# Patient Record
Sex: Male | Born: 1980 | Race: White | Hispanic: No | Marital: Married | Smoking: Never smoker
Health system: Southern US, Community
[De-identification: ages and names within clinical notes are randomized; demographics above are authoritative.]

## PROBLEM LIST (undated history)

## (undated) DIAGNOSIS — T7840XA Allergy, unspecified, initial encounter: Secondary | ICD-10-CM

## (undated) HISTORY — DX: Allergy, unspecified, initial encounter: T78.40XA

---

## 2004-08-02 HISTORY — PX: TONSILLECTOMY: SUR1361

## 2015-04-23 ENCOUNTER — Ambulatory Visit: Payer: Self-pay | Admitting: Medical

## 2015-04-23 ENCOUNTER — Ambulatory Visit (HOSPITAL_BASED_OUTPATIENT_CLINIC_OR_DEPARTMENT_OTHER)
Admission: RE | Admit: 2015-04-23 | Discharge: 2015-04-23 | Disposition: A | Discharge status: Home / Self Care | Payer: Managed Care, Other (non HMO) | Source: Ambulatory Visit | Attending: Medical | Admitting: Medical

## 2015-04-23 ENCOUNTER — Ambulatory Visit (INDEPENDENT_AMBULATORY_CARE_PROVIDER_SITE_OTHER): Payer: Managed Care, Other (non HMO) | Admitting: Family Medicine

## 2015-04-23 ENCOUNTER — Encounter: Payer: Self-pay | Admitting: Medical

## 2015-04-23 ENCOUNTER — Encounter: Payer: Self-pay | Admitting: Family Medicine

## 2015-04-23 ENCOUNTER — Ambulatory Visit (INDEPENDENT_AMBULATORY_CARE_PROVIDER_SITE_OTHER): Payer: Managed Care, Other (non HMO) | Admitting: Medical

## 2015-04-23 VITALS — BP 114/74 | HR 78 | Ht 69.0 in | Wt 170.0 lb

## 2015-04-23 VITALS — BP 118/76 | HR 88 | Temp 98.1°F | Resp 16 | Ht 68.0 in | Wt 174.0 lb

## 2015-04-23 DIAGNOSIS — M25571 Pain in right ankle and joints of right foot: Secondary | ICD-10-CM | POA: Diagnosis present

## 2015-04-23 DIAGNOSIS — M7661 Achilles tendinitis, right leg: Secondary | ICD-10-CM

## 2015-04-23 NOTE — Patient Instructions (Signed)
Ace wrap or compression sock. Ibuprofen otc. Ice twice daily if pain reflares. Continue rest.  Refer to sports medicine. Get xray today of ankle/achilles area.  Follow up here as needed.  Note also if you want your could schedule CPE.

## 2015-04-23 NOTE — Progress Notes (Signed)
Pre visit review using our clinic review tool, if applicable. No additional management support is needed unless otherwise documented below in the visit note. 

## 2015-04-23 NOTE — Progress Notes (Signed)
   Subjective:    Patient ID: Erik Mayer, male    DOB: 1981/02/17, 34 y.o.   MRN: [id number]  HPI  I have reviewed pt PMH, PSH, FH, Social History and Surgical History  No major medical problems.  Pt is a Education officer, museum, Pt works out 3-5 times a week(cardio and weights), caffeinated beverage very rare. Only green tea, Married- no children.   Pt has some joint pain. Mostly ankle presently. States pain about one month ago. Noticed some soreness noticed first at rest watching tv. But then 2 wks ago ran on treadmill and felt pain more in achilles tendon. Pt did take a break from treadmill. But he still has some faint pain back of heel. But the direct mid achilles pain has subsided.  Pt taking ibuprofen for this.     Review of Systems  Respiratory: Negative for cough, choking, shortness of breath and wheezing.   Cardiovascular: Negative for chest pain and palpitations.  Gastrointestinal: Negative for abdominal pain.  Musculoskeletal:       Rt achilles tendon pain.  Hematological: Negative for adenopathy. Does not bruise/bleed easily.    History reviewed. No pertinent past medical history.  Social History   Social History  . Marital Status: Married    Spouse Name: N/A  . Number of Children: N/A  . Years of Education: N/A   Occupational History  . Not on file.   Social History Main Topics  . Smoking status: Never Smoker   . Smokeless tobacco: Never Used  . Alcohol Use: 0.0 oz/week    0 Standard drinks or equivalent per week     Comment: occasionally. 5 drinks a week.  . Drug Use: No  . Sexual Activity: Yes   Other Topics Concern  . Not on file   Social History Narrative  . No narrative on file    Past Surgical History  Procedure Laterality Date  . Tonsillectomy  2006    Family History  Problem Relation Age of Onset  . Cancer Cousin     No Known Allergies  No current outpatient prescriptions on file prior to visit.   No  current facility-administered medications on file prior to visit.    BP 118/76 mmHg  Pulse 88  Temp(Src) 98.1 F (36.7 C) (Oral)  Resp 16  Ht  (1.727 m)  Wt 174 lb (78.926 kg)  BMI 26.46 kg/m2  SpO2 97%       Objective:   Physical Exam  General- No acute distress. Pleasant patient. Lungs- Clear, even and unlabored. Heart- regular rate and rhythm. Neurologic- CNII- XII grossly intact.  Rt foot/ankle normal- no swelling. No warmth. Pain on plantar flexion in achilles region. Pain at insertion site mild at achilles tendon and calcaneus.       Assessment & Plan:  Ace wrap or compression sock. Ibuprofen otc. Ice twice daily if pain reflares. Continue rest.  Refer to sports medicine. Get xray today of ankle/achilles area.  Follow up here as needed.  Note also if you want your could schedule CPE.

## 2015-04-23 NOTE — Patient Instructions (Signed)
You have Achilles Tendinopathy Ibuprofen 600mg three times a day with food OR aleve 2 tabs twice a day with food for pain and inflammation. Calf raises 3 sets of 10 on level ground once a day first. When these are easy, can do them one legged 3 sets of 10. Finally advance to doing them on a step. Can add heel walks, toe walks forward and backward as well Ice bucket 10-15 minutes at end of day - can ice 3-4 times a day. Avoid uneven ground, hills as much as possible. Heel lifts in shoes or shoes with a natural heel lift. Consider physical therapy, orthotics, nitro patches if not improving as expected. Follow up in 6 weeks. 

## 2015-04-29 NOTE — Progress Notes (Signed)
PCP and referred by: Esperanza Richters, PA-C  Subjective:   HPI: Patient is a 34 y.o. male here for right foot pain.  Patient denies known injury or trauma. He states about 1 month ago he started to get posterior ankle pain. Working out has not helped. Some swelling, tenderness. Tried icing, resting as well. Pain level 4/10. No prior issues.  No past medical history on file.  No current outpatient prescriptions on file prior to visit.   No current facility-administered medications on file prior to visit.    Past Surgical History  Procedure Laterality Date  . Tonsillectomy  2006    No Known Allergies  Social History   Social History  . Marital Status: Married    Spouse Name: N/A  . Number of Children: N/A  . Years of Education: N/A   Occupational History  . Not on file.   Social History Main Topics  . Smoking status: Never Smoker   . Smokeless tobacco: Never Used  . Alcohol Use: 0.0 oz/week    0 Standard drinks or equivalent per week     Comment: occasionally. 5 drinks a week.  . Drug Use: No  . Sexual Activity: Yes   Other Topics Concern  . Not on file   Social History Narrative    Family History  Problem Relation Age of Onset  . Cancer Cousin     BP 114/74 mmHg  Pulse 78  Ht  (1.753 m)  Wt 170 lb (77.111 kg)  BMI 25.09 kg/m2  Review of Systems: See HPI above.    Objective:  Physical Exam:  Gen: NAD  Right foot/ankle: No gross deformity, swelling, ecchymoses FROM ankle.  Able to do calf raise - minimal pain on single leg calf raise. TTP achilles tendon near insertion. Negative ant drawer and talar tilt.   Negative syndesmotic compression. Thompsons test negative. NV intact distally.    Assessment & Plan:  1. Right achilles tendinitis - heel lifts, home exercises reviewed.  Icing, nsaids as needed.  Consider PT, orthotics, nitro patches if not improving.  F/u in 6 weeks.

## 2015-04-29 NOTE — Assessment & Plan Note (Signed)
heel lifts, home exercises reviewed.  Icing, nsaids as needed.  Consider PT, orthotics, nitro patches if not improving.  F/u in 6 weeks.

## 2015-06-04 ENCOUNTER — Ambulatory Visit: Payer: Managed Care, Other (non HMO) | Admitting: Family Medicine

## 2015-07-16 ENCOUNTER — Telehealth: Payer: Self-pay | Admitting: Medical

## 2015-07-16 NOTE — Telephone Encounter (Signed)
Left message for patient to call about Flu Shot °

## 2015-10-01 ENCOUNTER — Telehealth: Payer: Self-pay | Admitting: Medical

## 2015-10-01 NOTE — Telephone Encounter (Signed)
Left message for patient to call about Flu Shot °

## 2016-10-20 ENCOUNTER — Encounter: Payer: Self-pay | Admitting: Medical

## 2016-10-25 ENCOUNTER — Encounter: Payer: Self-pay | Admitting: Medical

## 2016-10-27 ENCOUNTER — Ambulatory Visit (INDEPENDENT_AMBULATORY_CARE_PROVIDER_SITE_OTHER): Payer: Managed Care, Other (non HMO) | Admitting: Medical

## 2016-10-27 ENCOUNTER — Encounter: Payer: Self-pay | Admitting: Medical

## 2016-10-27 ENCOUNTER — Telehealth: Payer: Self-pay | Admitting: Medical

## 2016-10-27 VITALS — BP 102/66 | HR 67 | Temp 97.6°F | Resp 16 | Ht 68.0 in | Wt 172.4 lb

## 2016-10-27 DIAGNOSIS — Z23 Encounter for immunization: Secondary | ICD-10-CM | POA: Diagnosis not present

## 2016-10-27 DIAGNOSIS — Z113 Encounter for screening for infections with a predominantly sexual mode of transmission: Secondary | ICD-10-CM

## 2016-10-27 DIAGNOSIS — Z0000 Encounter for general adult medical examination without abnormal findings: Secondary | ICD-10-CM

## 2016-10-27 LAB — LIPID PANEL
CHOL/HDL RATIO: 3
Cholesterol: 193 mg/dL (ref 0–200)
HDL: 61.2 mg/dL (ref >39.00)
LDL CALC: 118 mg/dL — AB (ref 0–99)
NONHDL: 132.24
TRIGLYCERIDES: 70 mg/dL (ref 0.0–149.0)
VLDL: 14 mg/dL (ref 0.0–40.0)

## 2016-10-27 LAB — CBC WITH DIFFERENTIAL/PLATELET
BASOS ABS: 0 10*3/uL (ref 0.0–0.1)
Basophils Relative: 0.5 % (ref 0.0–3.0)
Eosinophils Absolute: 0.1 10*3/uL (ref 0.0–0.7)
Eosinophils Relative: 2.9 % (ref 0.0–5.0)
HCT: 46.5 % (ref 39.0–52.0)
Hemoglobin: 15.7 g/dL (ref 13.0–17.0)
Lymphocytes Relative: 40.1 % (ref 12.0–46.0)
Lymphs Abs: 1.7 10*3/uL (ref 0.7–4.0)
MCHC: 33.7 g/dL (ref 30.0–36.0)
MCV: 84 fl (ref 78.0–100.0)
MONO ABS: 0.4 10*3/uL (ref 0.1–1.0)
Monocytes Relative: 9.6 % (ref 3.0–12.0)
NEUTROS ABS: 2 10*3/uL (ref 1.4–7.7)
NEUTROS PCT: 46.9 % (ref 43.0–77.0)
PLATELETS: 179 10*3/uL (ref 150.0–400.0)
RBC: 5.54 Mil/uL (ref 4.22–5.81)
RDW: 13.4 % (ref 11.5–15.5)
WBC: 4.2 10*3/uL (ref 4.0–10.5)

## 2016-10-27 LAB — COMPREHENSIVE METABOLIC PANEL
ALK PHOS: 22 U/L — AB (ref 39–117)
ALT: 19 U/L (ref 0–53)
AST: 23 U/L (ref 0–37)
Albumin: 4.6 g/dL (ref 3.5–5.2)
BUN: 17 mg/dL (ref 6–23)
CO2: 28 meq/L (ref 19–32)
Calcium: 9.5 mg/dL (ref 8.4–10.5)
Chloride: 104 mEq/L (ref 96–112)
Creatinine, Ser: 1.09 mg/dL (ref 0.40–1.50)
GFR: 81.09 mL/min (ref >60.00)
GLUCOSE: 91 mg/dL (ref 70–99)
POTASSIUM: 4 meq/L (ref 3.5–5.1)
SODIUM: 139 meq/L (ref 135–145)
TOTAL PROTEIN: 7.1 g/dL (ref 6.0–8.3)
Total Bilirubin: 0.7 mg/dL (ref 0.2–1.2)

## 2016-10-27 LAB — TSH: TSH: 0.85 u[IU]/mL (ref 0.35–4.50)

## 2016-10-27 NOTE — Telephone Encounter (Signed)
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 In yellow folder.Can fax over.

## 2016-10-27 NOTE — Addendum Note (Signed)
Addended by: Regis BillSCATES, SHARON L on: 10/27/2016 10:05 AM   Modules accepted: Orders

## 2016-10-27 NOTE — Progress Notes (Signed)
Pre visit review using our clinic review tool, if applicable. No additional management support is needed unless otherwise documented below in the visit note/SLS  

## 2016-10-27 NOTE — Progress Notes (Signed)
Subjective:    Patient ID: Erik Mayer, male    DOB: 1981/03/08, 36 y.o.   MRN: [id number]  HPI  Pt in for wellness exam. Pt is fasting.  Pt works at  [employer]  Exercise-  5 days a week. Weight lift, run, biking, Walks dog.  Diet- moderate healthy diet.  Caffeine- hot tea.  Smoking status-no.   Alcohol- 10 beers a week but spread out over week. No binge drinking.  Married-yes.  Children-no Pt needs tdap per epic. He agrees to get.   Flu vaccine offered. After dicussion on where we are at in the season he declined.   Review of Systems  Constitutional: Negative for chills, fatigue and fever.  HENT: Negative for congestion, ear discharge, ear pain, postnasal drip, rhinorrhea and sinus pain.   Respiratory: Positive for cough. Negative for chest tightness.        Rare occasional cough just started today.  Cardiovascular: Negative for chest pain and palpitations.  Gastrointestinal: Negative for constipation, diarrhea and vomiting.  Endocrine: Negative for polydipsia, polyphagia and polyuria.  Genitourinary: Negative for dysuria, flank pain, frequency and hematuria.  Musculoskeletal: Negative for back pain, gait problem, myalgias and neck stiffness.  Skin: Negative for rash.  Neurological: Negative for dizziness and headaches.  Hematological: Negative for adenopathy. Does not bruise/bleed easily.  Psychiatric/Behavioral: Negative for agitation, confusion, self-injury and sleep disturbance. The patient is not nervous/anxious.     No past medical history on file.   Social History   Social History  . Marital status: Married    Spouse name: N/A  . Number of children: N/A  . Years of education: N/A   Occupational History  . Not on file.   Social History Main Topics  . Smoking status: Never Smoker  . Smokeless tobacco: Never Used  . Alcohol use 0.0 oz/week     Comment: occasionally. 5 drinks a week.  . Drug use: No  . Sexual  activity: Yes   Other Topics Concern  . Not on file   Social History Narrative  . No narrative on file    Past Surgical History:  Procedure Laterality Date  . TONSILLECTOMY  2006    Family History  Problem Relation Age of Onset  . Cancer Cousin     No Known Allergies  No current outpatient prescriptions on file prior to visit.   No current facility-administered medications on file prior to visit.     BP (!) 0/0   Ht 5\' 8"  (1.727 m)   Wt 172 lb 6 oz (78.2 kg)   BMI 26.21 kg/m  102/66(not sure why state 0/0)??     Objective:   Physical Exam  General Mental Status- Alert. General Appearance- Not in acute distress.   Skin General: Color- Normal Color. Moisture- Normal Moisture.  Neck Carotid Arteries- Normal color. Moisture- Normal Moisture. No carotid bruits. No JVD.  Chest and Lung Exam Auscultation: Breath Sounds:-Normal.  Cardiovascular Auscultation:Rythm- Regular. Murmurs & Other Heart Sounds:Auscultation of the heart reveals- No Murmurs.  Abdomen Inspection:-Inspeection Normal. Palpation/Percussion:Note:No mass. Palpation and Percussion of the abdomen reveal- Non Tender, Non Distended + BS, no rebound or guarding.    Neurologic Cranial Nerve exam:- CN III-XII intact(No nystagmus), symmetric smile. Finger to Nose:- Normal/Intact Strength:- 5/5 equal and symmetric strength both upper and lower extremities.  Genital- pt declined.      Assessment & Plan:  For you wellness exam today I have ordered cbc, cmp, tsh, lipid panel, ua and hiv.  Vaccine given today. tdap.  Recommend continue  exercise and healthy diet.  We will let you know lab results as they come in.  Follow up date appointment will be determined after lab review.   If occasional mild allergies flare can [place] flonase and claritin otc.  When labs in will fill out you form and fax.  Christan Defranco, Ramon Dredge, PA-C

## 2016-10-27 NOTE — Patient Instructions (Addendum)
For you wellness exam today I have ordered cbc, cmp, tsh, lipid panel, ua and hiv.  Vaccine given today. tdap.  Recommend continue  exercise and healthy diet.  We will let you know lab results as they come in.  Follow up date appointment will be determined after lab review.   If occasional mild allergies flare can [place] flonase and claritin otc.  When labs in will fill out you form and fax.   Preventive Care 18-39 Years, Male Preventive care refers to lifestyle choices and visits with your health care provider that can promote health and wellness. What does preventive care include?  A yearly physical exam. This is also called an annual well check.  Dental exams once or twice a year.  Routine eye exams. Ask your health care provider how often you should have your eyes checked.  Personal lifestyle choices, including:  Daily care of your teeth and gums.  Regular physical activity.  Eating a healthy diet.  Avoiding tobacco and drug use.  Limiting alcohol use.  Practicing safe sex. What happens during an annual well check? The services and screenings done by your health care provider during your annual well check will depend on your age, overall health, lifestyle risk factors, and family history of disease. Counseling  Your health care provider may ask you questions about your:  Alcohol use.  Tobacco use.  Drug use.  Emotional well-being.  Home and relationship well-being.  Sexual activity.  Eating habits.  Work and work Statistician. Screening  You may have the following tests or measurements:  Height, weight, and BMI.  Blood pressure.  Lipid and cholesterol levels. These may be checked every 5 years starting at age 17.  Diabetes screening. This is done by checking your blood sugar (glucose) after you have not eaten for a while (fasting).  Skin check.  Hepatitis C blood test.  Hepatitis B blood test.  Sexually transmitted disease (STD)  testing. Discuss your test results, treatment options, and if necessary, the need for more tests with your health care provider. Vaccines  Your health care provider may recommend certain vaccines, such as:  Influenza vaccine. This is recommended every year.  Tetanus, diphtheria, and acellular pertussis (Tdap, Td) vaccine. You may need a Td booster every 10 years.  Varicella vaccine. You may need this if you have not been vaccinated.  HPV vaccine. If you are 79 or younger, you may need three doses over 6 months.  Measles, mumps, and rubella (MMR) vaccine. You may need at least one dose of MMR.You may also need a second dose.  Pneumococcal 13-valent conjugate (PCV13) vaccine. You may need this if you have certain conditions and have not been vaccinated.  Pneumococcal polysaccharide (PPSV23) vaccine. You may need one or two doses if you smoke cigarettes or if you have certain conditions.  Meningococcal vaccine. One dose is recommended if you are age 106-21 years and a first-year college student living in a residence hall, or if you have one of several medical conditions. You may also need additional booster doses.  Hepatitis A vaccine. You may need this if you have certain conditions or if you travel or work in places where you may be exposed to hepatitis A.  Hepatitis B vaccine. You may need this if you have certain conditions or if you travel or work in places where you may be exposed to hepatitis B.  Haemophilus influenzae type b (Hib) vaccine. You may need this if you have certain risk factors. Talk to  your health care provider about which screenings and vaccines you need and how often you need them. This information is not intended to replace advice given to you by your health care provider. Make sure you discuss any questions you have with your health care provider. Document Released: 09/14/2001 Document Revised: 04/07/2016 Document Reviewed: 05/20/2015 Elsevier Interactive Patient  Education  2017 [employer].

## 2016-10-28 LAB — HIV ANTIBODY (ROUTINE TESTING W REFLEX): HIV: NONREACTIVE

## 2016-10-28 NOTE — Progress Notes (Signed)
Viewed by Ardyth Harpshristopher Angelino on 10/27/2016 1:32 PM

## 2016-10-28 NOTE — Telephone Encounter (Signed)
Called and [facility] @ 11:41am @ [phone]) asking the pt to RTC regarding Physician Screening Form.//AB/CMA

## 2016-11-01 NOTE — Telephone Encounter (Signed)
Called patient to give Results and he wanted a note to go back to Angie to let her know that "nothing else is needed for form" and he does not want a copy/SLS 04/02

## 2016-11-01 NOTE — Telephone Encounter (Signed)
Physician Screening Collection Form:Standard has been faxed and confirmation received.  Original sent for scanning.//AB/CMA

## 2018-03-30 ENCOUNTER — Encounter: Payer: Self-pay | Admitting: Medical

## 2018-03-30 ENCOUNTER — Ambulatory Visit (INDEPENDENT_AMBULATORY_CARE_PROVIDER_SITE_OTHER): Payer: Managed Care, Other (non HMO) | Admitting: Medical

## 2018-03-30 VITALS — BP 103/67 | HR 68 | Temp 98.1°F | Resp 16 | Ht 68.0 in | Wt 177.4 lb

## 2018-03-30 DIAGNOSIS — Z0000 Encounter for general adult medical examination without abnormal findings: Secondary | ICD-10-CM | POA: Diagnosis not present

## 2018-03-30 LAB — LIPID PANEL
CHOLESTEROL: 184 mg/dL (ref 0–200)
HDL: 59.6 mg/dL (ref >39.00)
LDL CALC: 114 mg/dL — AB (ref 0–99)
NonHDL: 124.36
TRIGLYCERIDES: 54 mg/dL (ref 0.0–149.0)
Total CHOL/HDL Ratio: 3
VLDL: 10.8 mg/dL (ref 0.0–40.0)

## 2018-03-30 LAB — POC URINALSYSI DIPSTICK (AUTOMATED)
Bilirubin, UA: NEGATIVE
Blood, UA: NEGATIVE
GLUCOSE UA: NEGATIVE
Ketones, UA: NEGATIVE
LEUKOCYTES UA: NEGATIVE
Nitrite, UA: NEGATIVE
PROTEIN UA: NEGATIVE
SPEC GRAV UA: 1.02 (ref 1.010–1.025)
Urobilinogen, UA: NEGATIVE E.U./dL — AB
pH, UA: 6 (ref 5.0–8.0)

## 2018-03-30 LAB — CBC WITH DIFFERENTIAL/PLATELET
BASOS PCT: 0.4 % (ref 0.0–3.0)
Basophils Absolute: 0 10*3/uL (ref 0.0–0.1)
EOS PCT: 2.7 % (ref 0.0–5.0)
Eosinophils Absolute: 0.1 10*3/uL (ref 0.0–0.7)
HEMATOCRIT: 47.5 % (ref 39.0–52.0)
HEMOGLOBIN: 16 g/dL (ref 13.0–17.0)
LYMPHS PCT: 29.5 % (ref 12.0–46.0)
Lymphs Abs: 1.2 10*3/uL (ref 0.7–4.0)
MCHC: 33.6 g/dL (ref 30.0–36.0)
MCV: 83.7 fl (ref 78.0–100.0)
MONO ABS: 0.5 10*3/uL (ref 0.1–1.0)
MONOS PCT: 11.2 % (ref 3.0–12.0)
Neutro Abs: 2.4 10*3/uL (ref 1.4–7.7)
Neutrophils Relative %: 56.2 % (ref 43.0–77.0)
Platelets: 167 10*3/uL (ref 150.0–400.0)
RBC: 5.68 Mil/uL (ref 4.22–5.81)
RDW: 13.3 % (ref 11.5–15.5)
WBC: 4.2 10*3/uL (ref 4.0–10.5)

## 2018-03-30 LAB — COMPREHENSIVE METABOLIC PANEL
ALBUMIN: 4.5 g/dL (ref 3.5–5.2)
ALK PHOS: 30 U/L — AB (ref 39–117)
ALT: 18 U/L (ref 0–53)
AST: 20 U/L (ref 0–37)
BUN: 16 mg/dL (ref 6–23)
CALCIUM: 9.4 mg/dL (ref 8.4–10.5)
CO2: 30 mEq/L (ref 19–32)
Chloride: 105 mEq/L (ref 96–112)
Creatinine, Ser: 1.14 mg/dL (ref 0.40–1.50)
GFR: 76.41 mL/min (ref >60.00)
Glucose, Bld: 76 mg/dL (ref 70–99)
POTASSIUM: 4.5 meq/L (ref 3.5–5.1)
Sodium: 140 mEq/L (ref 135–145)
Total Bilirubin: 0.5 mg/dL (ref 0.2–1.2)
Total Protein: 6.8 g/dL (ref 6.0–8.3)

## 2018-03-30 NOTE — Patient Instructions (Signed)
For you wellness exam today I have ordered cbc, cmp, lipid panel, and ua  Vaccine up to date.  Recommend exercise and healthy diet.  We will let you know lab results as they come in.  Follow up date appointment will be determined after lab review.    Preventive Care 18-39 Years, Male Preventive care refers to lifestyle choices and visits with your health care provider that can promote health and wellness. What does preventive care include?  A yearly physical exam. This is also called an annual well check.  Dental exams once or twice a year.  Routine eye exams. Ask your health care provider how often you should have your eyes checked.  Personal lifestyle choices, including: ? Daily care of your teeth and gums. ? Regular physical activity. ? Eating a healthy diet. ? Avoiding tobacco and drug use. ? Limiting alcohol use. ? Practicing safe sex. What happens during an annual well check? The services and screenings done by your health care provider during your annual well check will depend on your age, overall health, lifestyle risk factors, and family history of disease. Counseling Your health care provider may ask you questions about your:  Alcohol use.  Tobacco use.  Drug use.  Emotional well-being.  Home and relationship well-being.  Sexual activity.  Eating habits.  Work and work Statistician.  Screening You may have the following tests or measurements:  Height, weight, and BMI.  Blood pressure.  Lipid and cholesterol levels. These may be checked every 5 years starting at age 40.  Diabetes screening. This is done by checking your blood sugar (glucose) after you have not eaten for a while (fasting).  Skin check.  Hepatitis C blood test.  Hepatitis B blood test.  Sexually transmitted disease (STD) testing.  Discuss your test results, treatment options, and if necessary, the need for more tests with your health care provider. Vaccines Your health care  provider may recommend certain vaccines, such as:  Influenza vaccine. This is recommended every year.  Tetanus, diphtheria, and acellular pertussis (Tdap, Td) vaccine. You may need a Td booster every 10 years.  Varicella vaccine. You may need this if you have not been vaccinated.  HPV vaccine. If you are 85 or younger, you may need three doses over 6 months.  Measles, mumps, and rubella (MMR) vaccine. You may need at least one dose of MMR.You may also need a second dose.  Pneumococcal 13-valent conjugate (PCV13) vaccine. You may need this if you have certain conditions and have not been vaccinated.  Pneumococcal polysaccharide (PPSV23) vaccine. You may need one or two doses if you smoke cigarettes or if you have certain conditions.  Meningococcal vaccine. One dose is recommended if you are age 9-21 years and a first-year college student living in a residence hall, or if you have one of several medical conditions. You may also need additional booster doses.  Hepatitis A vaccine. You may need this if you have certain conditions or if you travel or work in places where you may be exposed to hepatitis A.  Hepatitis B vaccine. You may need this if you have certain conditions or if you travel or work in places where you may be exposed to hepatitis B.  Haemophilus influenzae type b (Hib) vaccine. You may need this if you have certain risk factors.  Talk to your health care provider about which screenings and vaccines you need and how often you need them. This information is not intended to replace advice given to  you by your health care provider. Make sure you discuss any questions you have with your health care provider. Document Released: 09/14/2001 Document Revised: 04/07/2016 Document Reviewed: 05/20/2015 Elsevier Interactive Patient Education  [employer].

## 2018-03-30 NOTE — Progress Notes (Signed)
Subjective:    Patient ID: Erik Mayer, male    DOB: 09-15-1980, 37 y.o.   MRN: [id number]  HPI  Pt in for CPE. He is fasting.   Up to date on t-dap.  Upcoming vacation coming up to [place].  Pt works at  [employer]  Exercise-  Not exercising like he was last year. But still walks dog.  Diet- moderate healthy diet.  Caffeine- hot tea.  Smoking status-no.   Alcohol- 5 beers a week but spread out over week. No binge drinking.  Married-yes.  Children-no    Review of Systems  Constitutional: Negative for chills, fatigue and fever.  HENT: Negative for congestion, ear discharge and facial swelling.   Respiratory: Negative for cough, chest tightness, shortness of breath and wheezing.   Cardiovascular: Negative for chest pain and palpitations.  Gastrointestinal: Negative for abdominal pain.  Genitourinary: Negative for difficulty urinating, enuresis, flank pain, frequency and urgency.  Musculoskeletal: Negative for back pain.  Skin: Negative for rash.  Neurological: Negative for dizziness, tremors, seizures, speech difficulty, weakness, light-headedness and headaches.  Hematological: Negative for adenopathy. Does not bruise/bleed easily.  Psychiatric/Behavioral: Negative for behavioral problems and confusion.    Past Medical History:  Diagnosis Date  . Allergy      Social History   Socioeconomic History  . Marital status: Married    Spouse name: Not on file  . Number of children: Not on file  . Years of education: Not on file  . Highest education level: Not on file  Occupational History  . Not on file  Social Needs  . Financial resource strain: Not on file  . Food insecurity:    Worry: Not on file    Inability: Not on file  . Transportation needs:    Medical: Not on file    Non-medical: Not on file  Tobacco Use  . Smoking status: Never Smoker  . Smokeless tobacco: Never Used  Substance and Sexual Activity    . Alcohol use: Yes    Alcohol/week: 0.0 standard drinks    Comment: occasionally. 5 drinks a week.  . Drug use: No  . Sexual activity: Yes  Lifestyle  . Physical activity:    Days per week: Not on file    Minutes per session: Not on file  . Stress: Not on file  Relationships  . Social connections:    Talks on phone: Not on file    Gets together: Not on file    Attends religious service: Not on file    Active member of club or organization: Not on file    Attends meetings of clubs or organizations: Not on file    Relationship status: Not on file  . Intimate partner violence:    Fear of current or ex partner: Not on file    Emotionally abused: Not on file    Physically abused: Not on file    Forced sexual activity: Not on file  Other Topics Concern  . Not on file  Social History Narrative  . Not on file    Past Surgical History:  Procedure Laterality Date  . TONSILLECTOMY  2006    Family History  Problem Relation Age of Onset  . Cancer Cousin     No Known Allergies  No current outpatient medications on file prior to visit.   No current facility-administered medications on file prior to visit.     BP 103/67   Pulse 68   Temp 98.1 F (  36.7 C) (Oral)   Resp 16   Ht 5\' 8"  (1.727 m)   Wt 177 lb 6.4 oz (80.5 kg)   SpO2 99%   BMI 26.97 kg/m       Objective:   Physical Exam  General Mental Status- Alert. General Appearance- Not in acute distress.   Skin General: Color- Normal Color. Moisture- Normal Moisture. No worrisome skin lesions on exam today.  Neck Carotid Arteries- Normal color. Moisture- Normal Moisture. No carotid bruits. No JVD.  Chest and Lung Exam Auscultation: Breath Sounds:-Normal.  Cardiovascular Auscultation:Rythm- Regular. Murmurs & Other Heart Sounds:Auscultation of the heart reveals- No Murmurs.  Abdomen Inspection:-Inspeection Normal. Palpation/Percussion:Note:No mass. Palpation and Percussion of the abdomen reveal- Non  Tender, Non Distended + BS, no rebound or guarding.  Neurologic Cranial Nerve exam:- CN III-XII intact(No nystagmus), symmetric smile. Strength:- 5/5 equal and symmetric strength both upper and lower extremities.    HEENT Head- Normal. Ear Auditory Canal - Left- Normal. Right - Normal.Tympanic Membrane- Left- Normal. Right- Normal. Eye Sclera/Conjunctiva- Left- Normal. Right- Normal. Nose & Sinuses Nasal Mucosa- Left-  Boggy and Congested. Right-  Boggy and  Congested.Bilateral no  maxillary and no  frontal sinus pressure. Mouth & Throat Lips: Upper Lip- Normal: no dryness, cracking, pallor, cyanosis, or vesicular eruption. Lower Lip-Normal: no dryness, cracking, pallor, cyanosis or vesicular eruption. Buccal Mucosa- Bilateral- No Aphthous ulcers. Oropharynx- No Discharge or Erythema. Tonsils: Characteristics- Bilateral- No Erythema or Congestion. Size/Enlargement- Bilateral- No enlargement. Discharge- bilateral-None.    .     Assessment & Plan:  For you wellness exam today I have ordered cbc, cmp, lipid panel, and ua  Vaccine up to date.  Recommend exercise and healthy diet.  We will let you know lab results as they come in.  Follow up date appointment will be determined after lab review.    Esperanza RichtersEdward Azayla Polo, PA-C

## 2018-04-04 ENCOUNTER — Telehealth: Payer: Self-pay | Admitting: Medical

## 2018-04-04 NOTE — Telephone Encounter (Signed)
I filled out his quest exam form. Will you fax it. I placed it on your desk.

## 2018-04-04 NOTE — Telephone Encounter (Signed)
For faxed

## 2019-07-09 ENCOUNTER — Other Ambulatory Visit: Payer: Self-pay

## 2019-07-10 ENCOUNTER — Ambulatory Visit (INDEPENDENT_AMBULATORY_CARE_PROVIDER_SITE_OTHER): Payer: Managed Care, Other (non HMO) | Admitting: Medical

## 2019-07-10 ENCOUNTER — Other Ambulatory Visit: Payer: Self-pay

## 2019-07-10 ENCOUNTER — Encounter: Payer: Self-pay | Admitting: Medical

## 2019-07-10 ENCOUNTER — Telehealth: Payer: Self-pay | Admitting: Medical

## 2019-07-10 ENCOUNTER — Ambulatory Visit (HOSPITAL_BASED_OUTPATIENT_CLINIC_OR_DEPARTMENT_OTHER)
Admission: RE | Admit: 2019-07-10 | Discharge: 2019-07-10 | Disposition: A | Discharge status: Home / Self Care | Payer: Managed Care, Other (non HMO) | Source: Ambulatory Visit | Attending: Medical | Admitting: Medical

## 2019-07-10 VITALS — BP 118/64 | HR 64 | Temp 97.7°F | Resp 12 | Ht 68.0 in | Wt 165.2 lb

## 2019-07-10 DIAGNOSIS — S62667A Nondisplaced fracture of distal phalanx of left little finger, initial encounter for closed fracture: Secondary | ICD-10-CM

## 2019-07-10 DIAGNOSIS — M79645 Pain in left finger(s): Secondary | ICD-10-CM | POA: Diagnosis present

## 2019-07-10 NOTE — Progress Notes (Signed)
   Subjective:    Patient ID: Erik Mayer, male    DOB: 1981-04-09, 38 y.o.   MRN: [id number]  HPI  Pt in for 5th digit injury. He smashed his finger between 2- 20 lb dumbells. He did this 5 days ago. He feels dip joint mild unstable.  Rt is right handed.  Pain is minimal most of time.  Review of Systems  Musculoskeletal:       See hpi.       Objective:   Physical Exam  General- no acute distress. Left hand- 5th digit swollen,bruised distal 3rd aspect, mild swollen and faint deviated. Dip joint mild tender. Has about 90 % full flexion and extension.        Assessment & Plan:  For your left 5th digit pain will get xray of digit today.  Can use tylenol or ibuprofen.  Will see if fracture seen. Might need to refer to sports medicine depending on results. If no fracture then recommend splinting and video visit follow up in 5 days to assess rom and joint.  Follow date to be determined.   After pt left his xray came back showing fracture. Decided to refer to sports med MD. Placed the referral and notified pt by my chart.     Mackie Pai, PA-C

## 2019-07-10 NOTE — Patient Instructions (Addendum)
For your left 5th digit pain will get xray of digit today.  Can use tylenol or ibuprofen if needed.  Will see if fracture seen. Might need to refer to sports medicine depending on results. If no fracture then recommend splinting and video visit follow up in 5 days to assess rom and joint.   After pt left his xray came back showing fracture. Decided to refer to sports med MD. Placed the referral and notified pt by my chart.

## 2019-07-10 NOTE — Telephone Encounter (Signed)
Referral to sports medicine placed

## 2019-07-11 ENCOUNTER — Encounter: Payer: Self-pay | Admitting: Family Medicine

## 2019-07-11 ENCOUNTER — Other Ambulatory Visit: Payer: Self-pay

## 2019-07-11 ENCOUNTER — Ambulatory Visit (INDEPENDENT_AMBULATORY_CARE_PROVIDER_SITE_OTHER): Payer: Managed Care, Other (non HMO) | Admitting: Family Medicine

## 2019-07-11 DIAGNOSIS — S62639A Displaced fracture of distal phalanx of unspecified finger, initial encounter for closed fracture: Secondary | ICD-10-CM

## 2019-07-11 NOTE — Progress Notes (Signed)
Erik Mayer - 38 y.o. male MRN [id number]  Date of birth: October 13, 1980  SUBJECTIVE:  Including CC & ROS.  Chief Complaint  Patient presents with  . Finger Injury    left hand pinky finger x 07/06/2019    Erik Mayer is a 37 y.o. male that is  Presenting with pinky finger pain on the left hand. He smashed his distal phalanx between 2 dumbbells.  And x-ray was performed yesterday that showed a comminuted nondisplaced fracture of the distal phalanx.  He is having swelling and pain at this area.  He still maintains flexion and extension.  The pain is worse if it is compressed too tightly.  Denies any numbness or tingling.  Pain is constant and mild..  Independent review of the left finger x-ray from 12/8 shows comminuted fracture of the distal phalanx.   Review of Systems  Constitutional: Negative for fever.  HENT: Negative for congestion.   Respiratory: Negative for cough.   Cardiovascular: Negative for chest pain.  Gastrointestinal: Negative for abdominal pain.  Musculoskeletal: Negative for gait problem.  Skin: Positive for color change.  Neurological: Negative for weakness.  Hematological: Negative for adenopathy.    HISTORY: Past Medical, Surgical, Social, and Family History Reviewed & Updated per EMR.   Pertinent Historical Findings include:  Past Medical History:  Diagnosis Date  . Allergy     Past Surgical History:  Procedure Laterality Date  . TONSILLECTOMY  2006    No Known Allergies  Family History  Problem Relation Age of Onset  . Cancer Cousin      Social History   Socioeconomic History  . Marital status: Married    Spouse name: Not on file  . Number of children: Not on file  . Years of education: Not on file  . Highest education level: Not on file  Occupational History  . Not on file  Social Needs  . Financial resource strain: Not on file  . Food insecurity    Worry: Not on file    Inability: Not on file  . Transportation needs   Medical: Not on file    Non-medical: Not on file  Tobacco Use  . Smoking status: Never Smoker  . Smokeless tobacco: Never Used  Substance and Sexual Activity  . Alcohol use: Yes    Alcohol/week: 0.0 standard drinks    Comment: occasionally. 5 drinks a week.  . Drug use: No  . Sexual activity: Yes  Lifestyle  . Physical activity    Days per week: Not on file    Minutes per session: Not on file  . Stress: Not on file  Relationships  . Social Musician on phone: Not on file    Gets together: Not on file    Attends religious service: Not on file    Active member of club or organization: Not on file    Attends meetings of clubs or organizations: Not on file    Relationship status: Not on file  . Intimate partner violence    Fear of current or ex partner: Not on file    Emotionally abused: Not on file    Physically abused: Not on file    Forced sexual activity: Not on file  Other Topics Concern  . Not on file  Social History Narrative  . Not on file     PHYSICAL EXAM:  VS: BP 118/78   Pulse 86   Ht 5\' 8"  (1.727 m)   Wt 160 lb (72.6  kg)   BMI 24.33 kg/m  Physical Exam Gen: NAD, alert, cooperative with exam, well-appearing ENT: normal lips, normal nasal mucosa,  Eye: normal EOM, normal conjunctiva and lids CV:  no edema, +2 pedal pulses   Resp: no accessory muscle use, non-labored,   Skin: no rashes, no areas of induration  Neuro: normal tone, normal sensation to touch Psych:  normal insight, alert and oriented MSK:  Left hand: Swelling and ecchymosis of the distal phalanx of the pinky finger. Normal flexion extension. Tenderness to palpation at the distal tip of the pinky finger. Neurovascularly intact     ASSESSMENT & PLAN:   Closed fracture of tuft of distal phalanx of finger Injury occurred on 12/4. Has good range of motion.  - placed in baseball splint  - counseled on supportive care - f/u in 3 weeks. Could consider removing splint.

## 2019-07-11 NOTE — Patient Instructions (Signed)
Nice to meet you Please try ice and elevation  Please use the splint  Please send me a message in MyChart with any questions or updates.  Please see me back in 3 weeks.   --Dr. Raeford Razor

## 2019-07-11 NOTE — Assessment & Plan Note (Signed)
Injury occurred on 12/4. Has good range of motion.  - placed in baseball splint  - counseled on supportive care - f/u in 3 weeks. Could consider removing splint.

## 2019-07-31 ENCOUNTER — Encounter: Payer: Self-pay | Admitting: Medical

## 2019-07-31 ENCOUNTER — Other Ambulatory Visit: Payer: Self-pay

## 2019-07-31 ENCOUNTER — Ambulatory Visit (INDEPENDENT_AMBULATORY_CARE_PROVIDER_SITE_OTHER): Payer: Managed Care, Other (non HMO) | Admitting: Medical

## 2019-07-31 VITALS — Ht 68.0 in | Wt 160.0 lb

## 2019-07-31 DIAGNOSIS — M25512 Pain in left shoulder: Secondary | ICD-10-CM | POA: Diagnosis not present

## 2019-07-31 DIAGNOSIS — G8929 Other chronic pain: Secondary | ICD-10-CM | POA: Diagnosis not present

## 2019-07-31 MED ORDER — TRIAMCINOLONE ACETONIDE 0.1 % EX CREA: 1.0000 "application " | TOPICAL_CREAM | Freq: Two times a day (BID) | CUTANEOUS | 1 refills | Status: DC

## 2019-07-31 NOTE — Patient Instructions (Signed)
For chronic left shoulder pain do think best to go ahead and refer to sports medicine. Advise you can call and get scheduled since you were to follow up on previous finger injury as well.   For skin rash will treat with kenalog cream and advise moisturizer. Has eczema type appearance. If does not respond to treatment could also refer you back to dermatologist.  Follow up 10 days or as needed
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
## 2019-08-01 ENCOUNTER — Ambulatory Visit: Payer: Managed Care, Other (non HMO) | Admitting: Family Medicine

## 2019-08-07 ENCOUNTER — Encounter: Payer: Self-pay | Admitting: Family Medicine

## 2019-08-07 ENCOUNTER — Ambulatory Visit (INDEPENDENT_AMBULATORY_CARE_PROVIDER_SITE_OTHER): Payer: Managed Care, Other (non HMO) | Admitting: Family Medicine

## 2019-08-07 ENCOUNTER — Other Ambulatory Visit: Payer: Self-pay

## 2019-08-07 VITALS — BP 114/75 | HR 80 | Ht 68.0 in | Wt 160.0 lb

## 2019-08-07 DIAGNOSIS — M25511 Pain in right shoulder: Secondary | ICD-10-CM

## 2019-08-07 DIAGNOSIS — M25512 Pain in left shoulder: Secondary | ICD-10-CM

## 2019-08-07 DIAGNOSIS — S62639D Displaced fracture of distal phalanx of unspecified finger, subsequent encounter for fracture with routine healing: Secondary | ICD-10-CM | POA: Diagnosis not present

## 2019-08-07 DIAGNOSIS — S62639A Displaced fracture of distal phalanx of unspecified finger, initial encounter for closed fracture: Secondary | ICD-10-CM

## 2019-08-07 DIAGNOSIS — G8929 Other chronic pain: Secondary | ICD-10-CM

## 2019-08-07 NOTE — Patient Instructions (Signed)
Good to see you Please try to use the splint as needed  Please try the range of motion for the finger  Please try the exercises for the shoulder  Please try ice if needed for the finger   Please send me a message in MyChart with any questions or updates.  Please see me back in 4-6 weeks or as needed.   --Dr. Jordan Likes
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
## 2019-08-08 DIAGNOSIS — M25511 Pain in right shoulder: Secondary | ICD-10-CM | POA: Insufficient documentation

## 2019-08-08 NOTE — Assessment & Plan Note (Signed)
Left shoulder with previous injury that could involve more labrum or clavicle. Right shoulder seems more impingement. Mild in nature.  - counseled on HEP and supportive care - consider [place], xray or PT if no improvement. Consider injection.

## 2019-08-08 NOTE — Assessment & Plan Note (Signed)
Injury on 12/4. Has been splinted. Some ecchymosis remains on this time.  - counseled on supportive care - can try weaning out of splint  - begin ROM exercises  - consider repeating imaging if pain still present.

## 2019-08-23 ENCOUNTER — Encounter: Payer: Self-pay | Admitting: Family Medicine

## 2019-08-24 ENCOUNTER — Other Ambulatory Visit: Payer: Self-pay | Admitting: Family Medicine

## 2019-08-24 DIAGNOSIS — S62639A Displaced fracture of distal phalanx of unspecified finger, initial encounter for closed fracture: Secondary | ICD-10-CM

## 2019-08-28 ENCOUNTER — Other Ambulatory Visit: Payer: Self-pay

## 2019-08-28 ENCOUNTER — Ambulatory Visit (HOSPITAL_BASED_OUTPATIENT_CLINIC_OR_DEPARTMENT_OTHER)
Admission: RE | Admit: 2019-08-28 | Discharge: 2019-08-28 | Disposition: A | Discharge status: Home / Self Care | Payer: Managed Care, Other (non HMO) | Source: Ambulatory Visit | Attending: Family Medicine | Admitting: Family Medicine

## 2019-08-28 DIAGNOSIS — S62639A Displaced fracture of distal phalanx of unspecified finger, initial encounter for closed fracture: Secondary | ICD-10-CM | POA: Diagnosis present

## 2019-08-30 ENCOUNTER — Telehealth: Payer: Self-pay | Admitting: Family Medicine

## 2019-08-30 NOTE — Telephone Encounter (Signed)
Informed of xrays.   Myra Rude, MD [facility] 08/30/2019, 8:48 AM

## 2019-09-21 ENCOUNTER — Encounter: Payer: Self-pay | Admitting: Family Medicine

## 2019-09-24 ENCOUNTER — Other Ambulatory Visit: Payer: Self-pay | Admitting: Family Medicine

## 2019-09-24 DIAGNOSIS — S62639A Displaced fracture of distal phalanx of unspecified finger, initial encounter for closed fracture: Secondary | ICD-10-CM

## 2019-12-14 ENCOUNTER — Encounter: Payer: Self-pay | Admitting: Medical

## 2019-12-17 MED ORDER — TRIAMCINOLONE ACETONIDE 0.1 % EX CREA: 1.0000 "application " | TOPICAL_CREAM | Freq: Two times a day (BID) | CUTANEOUS | 1 refills | Status: DC

## 2020-02-28 ENCOUNTER — Ambulatory Visit (INDEPENDENT_AMBULATORY_CARE_PROVIDER_SITE_OTHER): Payer: Managed Care, Other (non HMO) | Admitting: Medical

## 2020-02-28 ENCOUNTER — Other Ambulatory Visit: Payer: Self-pay

## 2020-02-28 VITALS — BP 102/70 | HR 68 | Resp 18 | Ht 68.0 in | Wt 157.0 lb

## 2020-02-28 DIAGNOSIS — Z0000 Encounter for general adult medical examination without abnormal findings: Secondary | ICD-10-CM | POA: Diagnosis not present

## 2020-02-28 LAB — CBC WITH DIFFERENTIAL/PLATELET
Basophils Absolute: 0 10*3/uL (ref 0.0–0.1)
Basophils Relative: 0.6 % (ref 0.0–3.0)
Eosinophils Absolute: 0.2 10*3/uL (ref 0.0–0.7)
Eosinophils Relative: 4.1 % (ref 0.0–5.0)
HCT: 44.6 % (ref 39.0–52.0)
Hemoglobin: 14.9 g/dL (ref 13.0–17.0)
Lymphocytes Relative: 35.9 % (ref 12.0–46.0)
Lymphs Abs: 1.5 10*3/uL (ref 0.7–4.0)
MCHC: 33.3 g/dL (ref 30.0–36.0)
MCV: 86 fl (ref 78.0–100.0)
Monocytes Absolute: 0.4 10*3/uL (ref 0.1–1.0)
Monocytes Relative: 10.2 % (ref 3.0–12.0)
Neutro Abs: 2 10*3/uL (ref 1.4–7.7)
Neutrophils Relative %: 49.2 % (ref 43.0–77.0)
Platelets: 151 10*3/uL (ref 150.0–400.0)
RBC: 5.19 Mil/uL (ref 4.22–5.81)
RDW: 13.3 % (ref 11.5–15.5)
WBC: 4.1 10*3/uL (ref 4.0–10.5)

## 2020-02-28 LAB — COMPREHENSIVE METABOLIC PANEL
ALT: 31 U/L (ref 0–53)
AST: 32 U/L (ref 0–37)
Albumin: 4.2 g/dL (ref 3.5–5.2)
Alkaline Phosphatase: 32 U/L — ABNORMAL LOW (ref 39–117)
BUN: 22 mg/dL (ref 6–23)
CO2: 29 mEq/L (ref 19–32)
Calcium: 9.1 mg/dL (ref 8.4–10.5)
Chloride: 107 mEq/L (ref 96–112)
Creatinine, Ser: 1.13 mg/dL (ref 0.40–1.50)
GFR: 71.9 mL/min (ref >60.00)
Glucose, Bld: 89 mg/dL (ref 70–99)
Potassium: 4.5 mEq/L (ref 3.5–5.1)
Sodium: 140 mEq/L (ref 135–145)
Total Bilirubin: 0.4 mg/dL (ref 0.2–1.2)
Total Protein: 6.5 g/dL (ref 6.0–8.3)

## 2020-02-28 LAB — LIPID PANEL
Cholesterol: 177 mg/dL (ref 0–200)
HDL: 70.1 mg/dL (ref >39.00)
LDL Cholesterol: 99 mg/dL (ref 0–99)
NonHDL: 106.5
Total CHOL/HDL Ratio: 3
Triglycerides: 39 mg/dL (ref 0.0–149.0)
VLDL: 7.8 mg/dL (ref 0.0–40.0)

## 2020-02-28 NOTE — Progress Notes (Signed)
Subjective:    Patient ID: Erik Mayer, male    DOB: 09/15/1980, 39 y.o.   MRN: [id number]  HPI  Pt in for CPE. He is fasting.   Up to date on t-dap.   Pt works [employer]  Exercise- Pt has been exercising 6-7 days a week. Weight lifting. Walks dog.  Diet-moderate healthy diet.  Caffeine-hot tea.  Smoking status-no.  Alcohol-3 beers a week but spread out over week. No binge drinking.  Married-yes.  Children-no  Pt has had covid vaccine series.  Review of Systems  Constitutional: Negative for chills, fatigue and fever.  Respiratory: Negative for cough, chest tightness, shortness of breath and wheezing.   Cardiovascular: Negative for chest pain and palpitations.  Gastrointestinal: Negative for abdominal pain, constipation, nausea and vomiting.  Endocrine: Negative for polydipsia, polyphagia and polyuria.  Genitourinary: Negative for dysuria.  Musculoskeletal: Negative for back pain.       Rt shoulder pain for about 2 years. Pain doing bench presses. On and off.   Pt has seen sports med. If pain persists he will let me know and will refer to PT.  Skin: Negative for rash.  Neurological: Negative for dizziness, seizures and headaches.  Hematological: Negative for adenopathy. Does not bruise/bleed easily.  Psychiatric/Behavioral: Negative for behavioral problems, decreased concentration, dysphoric mood and suicidal ideas. The patient is not nervous/anxious.    Past Medical History:  Diagnosis Date   Allergy      Social History   Socioeconomic History   Marital status: Married    Spouse name: Not on file   Number of children: Not on file   Years of education: Not on file   Highest education level: Not on file  Occupational History   Not on file  Tobacco Use   Smoking status: Never Smoker   Smokeless tobacco: Never Used  Substance and Sexual Activity   Alcohol use: Yes    Alcohol/week: 0.0  standard drinks    Comment: occasionally. 5 drinks a week.   Drug use: No   Sexual activity: Yes  Other Topics Concern   Not on file  Social History Narrative   Not on file   Social Determinants of Health   Financial Resource Strain:    Difficulty of Paying Living Expenses:   Food Insecurity:    Worried About Programme researcher, broadcasting/film/video in the Last Year:    Barista in the Last Year:   Transportation Needs:    Freight forwarder (Medical):    Lack of Transportation (Non-Medical):   Physical Activity:    Days of Exercise per Week:    Minutes of Exercise per Session:   Stress:    Feeling of Stress :   Social Connections:    Frequency of Communication with Friends and Family:    Frequency of Social Gatherings with Friends and Family:    Attends Religious Services:    Active Member of Clubs or Organizations:    Attends Engineer, structural:    Marital Status:   Intimate Partner Violence:    Fear of Current or Ex-Partner:    Emotionally Abused:    Physically Abused:    Sexually Abused:     Past Surgical History:  Procedure Laterality Date   TONSILLECTOMY  2006    Family History  Problem Relation Age of Onset   Cancer Cousin     No Known Allergies  Current Outpatient Medications on File Prior to Visit  Medication  Sig Dispense Refill   triamcinolone cream (KENALOG) 0.1 % Apply 1 application topically 2 (two) times daily. 30 g 1   No current facility-administered medications on file prior to visit.    BP 102/70 (BP Location: Left Arm, Patient Position: Sitting, Cuff Size: Normal)    Pulse 68    Resp 18    Ht 5\' 8"  (1.727 m)    Wt 157 lb (71.2 kg)    SpO2 98%    BMI 23.87 kg/m        Objective:   Physical Exam  General Mental Status- Alert. General Appearance- Not in acute distress.   Skin General: Color- Normal Color. Moisture- Normal Moisture. No worrisome moles or lesion.  Neck Carotid Arteries- Normal color.  Moisture- Normal Moisture. No carotid bruits. No JVD.  Chest and Lung Exam Auscultation: Breath Sounds:-Normal.  Cardiovascular Auscultation:Rythm- Regular. Murmurs & Other Heart Sounds:Auscultation of the heart reveals- No Murmurs.  Abdomen Inspection:-Inspeection Normal. Palpation/Percussion:Note:No mass. Palpation and Percussion of the abdomen reveal- Non Tender, Non Distended + BS, no rebound or guarding.    Neurologic Cranial Nerve exam:- CN III-XII intact(No nystagmus), symmetric smile. Finger to Nose:- Normal/Intact Strength:- 5/5 equal and symmetric strength both upper and lower extremities.  Genital exam- deferred.       Assessment & Plan:  For you wellness exam today I have ordered cbc, cmp and lipid panel.  Vaccine on review appear up to date.  Recommend continue exercise and healthy diet.  We will let you know lab results as they come in.  Follow up date appointment will be determined after lab review.   , PA-C

## 2020-02-28 NOTE — Patient Instructions (Addendum)
For you wellness exam today I have ordered cbc, cmp and lipid panel.  Vaccine on review appear up to date.  Recommend continue exercise and healthy diet.  We will let you know lab results as they come in.  Follow up date appointment will be determined after lab review.    Preventive Care 24-40 Years Old, Male Preventive care refers to lifestyle choices and visits with your health care provider that can promote health and wellness. This includes:  A yearly physical exam. This is also called an annual well check.  Regular dental and eye exams.  Immunizations.  Screening for certain conditions.  Healthy lifestyle choices, such as eating a healthy diet, getting regular exercise, not using drugs or products that contain nicotine and tobacco, and limiting alcohol use. What can I expect for my preventive care visit? Physical exam Your health care provider will check:  Height and weight. These may be used to calculate body mass index (BMI), which is a measurement that tells if you are at a healthy weight. Heart rate and blood pressure.  Your skin for abnormal spots. Counseling Your health care provider may ask you questions about:  Alcohol, tobacco, and drug use.  Emotional well-being.  Home and relationship well-being.  Sexual activity.  Eating habits.  Work and work Statistician. What immunizations do I need?  Influenza (flu) vaccine  This is recommended every year. Tetanus, diphtheria, and pertussis (Tdap) vaccine  You may need a Td booster every 10 years. Varicella (chickenpox) vaccine  You may need this vaccine if you have not already been vaccinated. Human papillomavirus (HPV) vaccine  If recommended by your health care provider, you may need three doses over 6 months. Measles, mumps, and rubella (MMR) vaccine  You may need at least one dose of MMR. You may also need a second dose. Meningococcal conjugate (MenACWY) vaccine  One dose is recommended if you  are 59-57 years old and a Market researcher living in a residence hall, or if you have one of several medical conditions. You may also need additional booster doses. Pneumococcal conjugate (PCV13) vaccine  You may need this if you have certain conditions and were not previously vaccinated. Pneumococcal polysaccharide (PPSV23) vaccine  You may need one or two doses if you smoke cigarettes or if you have certain conditions. Hepatitis A vaccine  You may need this if you have certain conditions or if you travel or work in places where you may be exposed to hepatitis A. Hepatitis B vaccine  You may need this if you have certain conditions or if you travel or work in places where you may be exposed to hepatitis B. Haemophilus influenzae type b (Hib) vaccine  You may need this if you have certain risk factors. You may receive vaccines as individual doses or as more than one vaccine together in one shot (combination vaccines). Talk with your health care provider about the risks and benefits of combination vaccines. What tests do I need? Blood tests  Lipid and cholesterol levels. These may be checked every 5 years starting at age 7.  Hepatitis C test.  Hepatitis B test. Screening   Diabetes screening. This is done by checking your blood sugar (glucose) after you have not eaten for a while (fasting).  Sexually transmitted disease (STD) testing. Talk with your health care provider about your test results, treatment options, and if necessary, the need for more tests. Follow these instructions at home: Eating and drinking   Eat a diet that includes  fresh fruits and vegetables, whole grains, lean protein, and low-fat dairy products.  Take vitamin and mineral supplements as recommended by your health care provider.  Do not drink alcohol if your health care provider tells you not to drink.  If you drink alcohol: ? Limit how much you have to 0-2 drinks a day. ? Be aware of how  much alcohol is in your drink. In the [place] one drink equals one 12 oz bottle of beer (355 mL), one 5 oz glass of wine (148 mL), or one 1 oz glass of hard liquor (44 mL). Lifestyle  Take daily care of your teeth and gums.  Stay active. Exercise for at least 30 minutes on 5 or more days each week.  Do not use any products that contain nicotine or tobacco, such as cigarettes, e-cigarettes, and chewing tobacco. If you need help quitting, ask your health care provider.  If you are sexually active, practice safe sex. Use a condom or other form of protection to prevent STIs (sexually transmitted infections). What's next?  Go to your health care provider once a year for a well check visit.  Ask your health care provider how often you should have your eyes and teeth checked.  Stay up to date on all vaccines. This information is not intended to replace advice given to you by your health care provider. Make sure you discuss any questions you have with your health care provider. Document Revised: 07/13/2018 Document Reviewed: 07/13/2018 Elsevier Patient Education  [place].   Testicular Self-Exam A self-exam of your testicles (testicular self-exam) is looking at and feeling your testicles for unusual lumps or swelling. Swelling, lumps, or pain can be caused by:  Injuries.  Puffiness, redness, and soreness (inflammation).  Infection.  Extra fluids around your testicle (hydrocele).  Twisted testicles (testicular torsion).  Cancer of the testicle (testicular cancer). Why is it important to do a self-exam of testicles? You may need to do self-exams if you are at risk for cancer of the testicles. You may be at risk if you have:  A testicle that has not descended (cryptorchidism).  A history of cancer of the testicle.  A family history of cancer of the testicle. How to do a self-exam of testicles It is easiest to do a self-exam after a warm bath or shower. Testicles are harder  to examine when you are cold. A normal testicle is egg-shaped and feels firm. It is smooth, and it is not tender. At the back of your testicles, there is a firm cord that feels like spaghetti (spermatic cord). Look and feel for changes  Stand and hold your penis away from your body.  Look at each testicle to check for lumps or swelling.  Roll each testicle between your thumb and finger. Feel the whole testicle. Feel for: ? Lumps. ? Swelling. ? Discomfort.  Check for swelling or tender bumps in the groin area. Your groin is where your lower belly (abdomen) meets your upper thighs. Contact a health care provider if:  You find a bump or lump. This may be like a small, hard bump that is the size of a pea.  You find swelling.  You find pain.  You find soreness.  You see or feel any other changes. Summary  A self-exam of your testicles is looking at and feeling your testicles for lumps or swelling.  You may need to do self-exams if you are at risk for cancer of the testicle.  You should check each  of your testicles for lumps, swelling, or discomfort.  You should check for swelling or tender bumps in the groin area. Your groin is where your lower belly (abdomen) meets your upper thighs. This information is not intended to replace advice given to you by your health care provider. Make sure you discuss any questions you have with your health care provider. Document Revised: 11/09/2018 Document Reviewed: 06/14/2016 Elsevier Patient Education  2020 [employer].

## 2020-06-07 ENCOUNTER — Other Ambulatory Visit: Payer: Self-pay | Admitting: Medical

## 2020-06-09 ENCOUNTER — Telehealth: Payer: Self-pay | Admitting: Medical

## 2020-06-09 ENCOUNTER — Encounter: Payer: Self-pay | Admitting: Medical

## 2020-06-09 DIAGNOSIS — G8929 Other chronic pain: Secondary | ICD-10-CM

## 2020-06-09 NOTE — Telephone Encounter (Signed)
Referral to sports med placed.
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
## 2020-06-12 ENCOUNTER — Encounter: Payer: Self-pay | Admitting: Family Medicine

## 2020-06-12 ENCOUNTER — Other Ambulatory Visit: Payer: Self-pay

## 2020-06-12 ENCOUNTER — Ambulatory Visit (INDEPENDENT_AMBULATORY_CARE_PROVIDER_SITE_OTHER): Payer: Managed Care, Other (non HMO) | Admitting: Family Medicine

## 2020-06-12 ENCOUNTER — Ambulatory Visit (INDEPENDENT_AMBULATORY_CARE_PROVIDER_SITE_OTHER): Payer: Managed Care, Other (non HMO)

## 2020-06-12 ENCOUNTER — Ambulatory Visit: Payer: Self-pay

## 2020-06-12 VITALS — BP 110/70 | HR 70 | Ht 68.0 in | Wt 165.2 lb

## 2020-06-12 DIAGNOSIS — M25511 Pain in right shoulder: Secondary | ICD-10-CM | POA: Diagnosis not present

## 2020-06-12 MED ORDER — NITROGLYCERIN 0.2 MG/HR TD PT24: MEDICATED_PATCH | TRANSDERMAL | 1 refills | Status: DC

## 2020-06-12 NOTE — Patient Instructions (Addendum)
Thank you for coming in today.  I've referred you to Physical Therapy.  Let us know if you don't hear from them in one week.  Continue rotator cuff strength program.   Add nitro patches.   Recheck in 6-8 weeks.  Let me know sooner if you have a problem.   Please get an Xray today before you leave  Nitroglycerin Protocol   Apply 1/4 nitroglycerin patch to affected area daily.  Change position of patch within the affected area every 24 hours.  You may experience a headache during the first 1-2 weeks of using the patch, these should subside.  If you experience headaches after beginning nitroglycerin patch treatment, you may take your preferred over the counter pain reliever.  Another side effect of the nitroglycerin patch is skin irritation or rash related to patch adhesive.  Please notify our office if you develop more severe headaches or rash, and stop the patch.  Tendon healing with nitroglycerin patch may require 12 to 24 weeks depending on the extent of injury.  Men should not use if taking Viagra, Cialis, or Levitra.   Do not use if you have migraines or rosacea.

## 2020-06-13 NOTE — Progress Notes (Signed)
X-ray right shoulder normal per radiology

## 2020-07-11 IMAGING — CR DG FINGER LITTLE 2+V*L*
3 series · 3 of 3 positions shown · non-contrast
Comparison: 07/10/2019

CLINICAL DATA: Follow-up fracture

EXAM:
LEFT LITTLE FINGER 2+V

[x finger pa left]
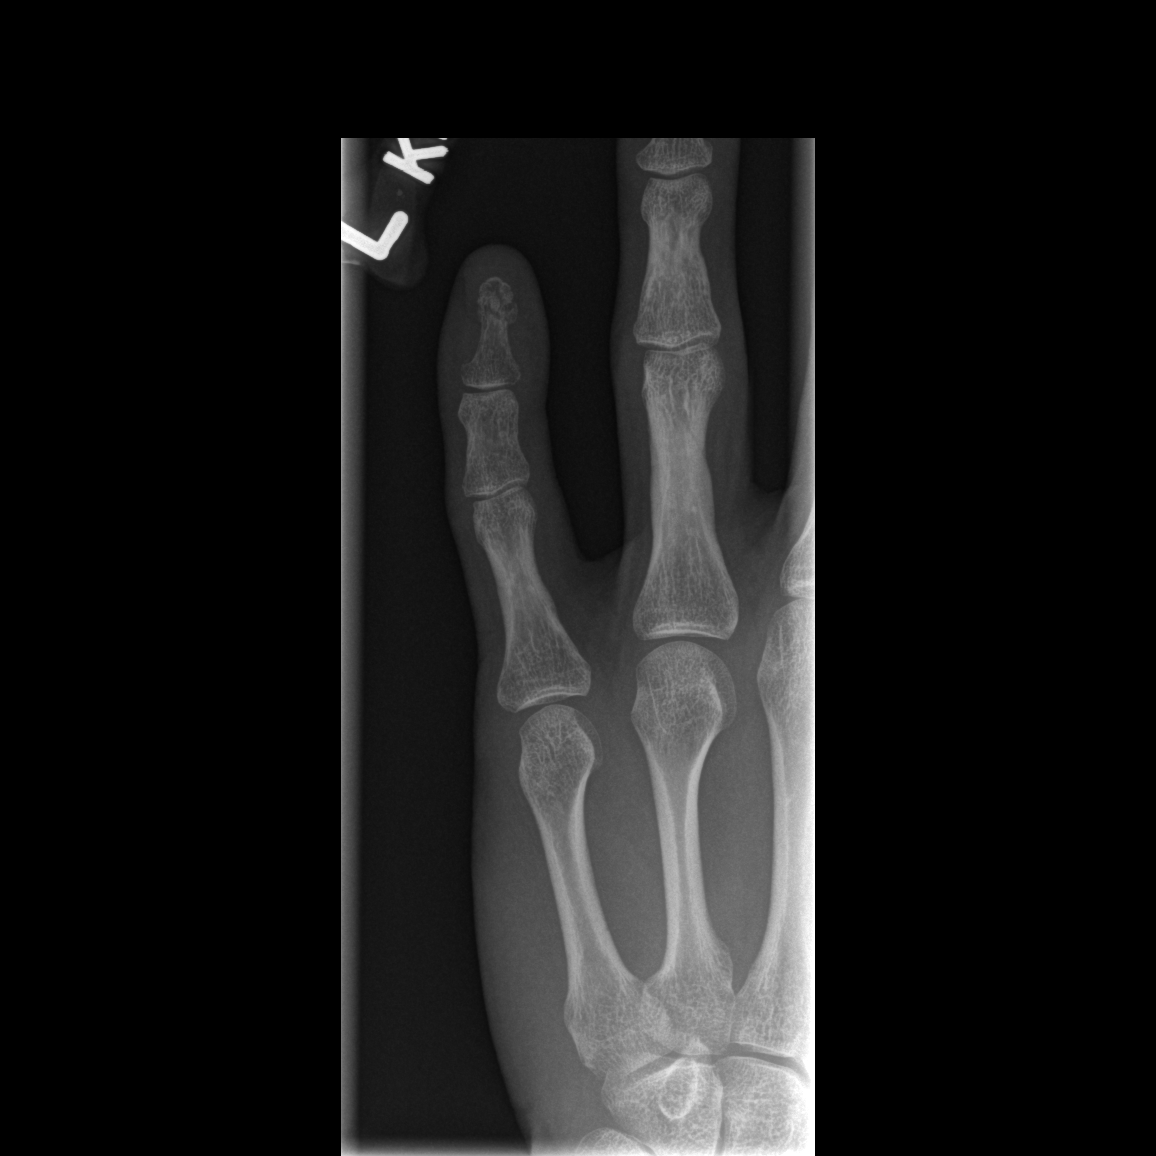

[x finger obl. left]
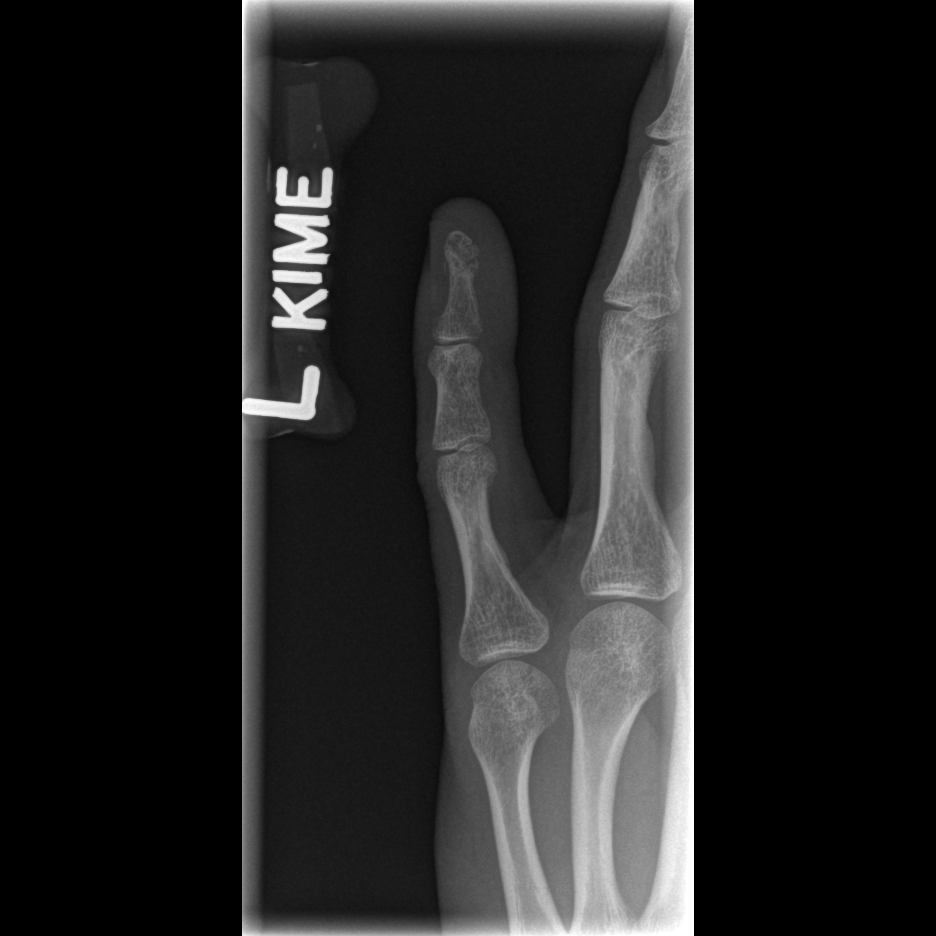

[x finger lateral left]
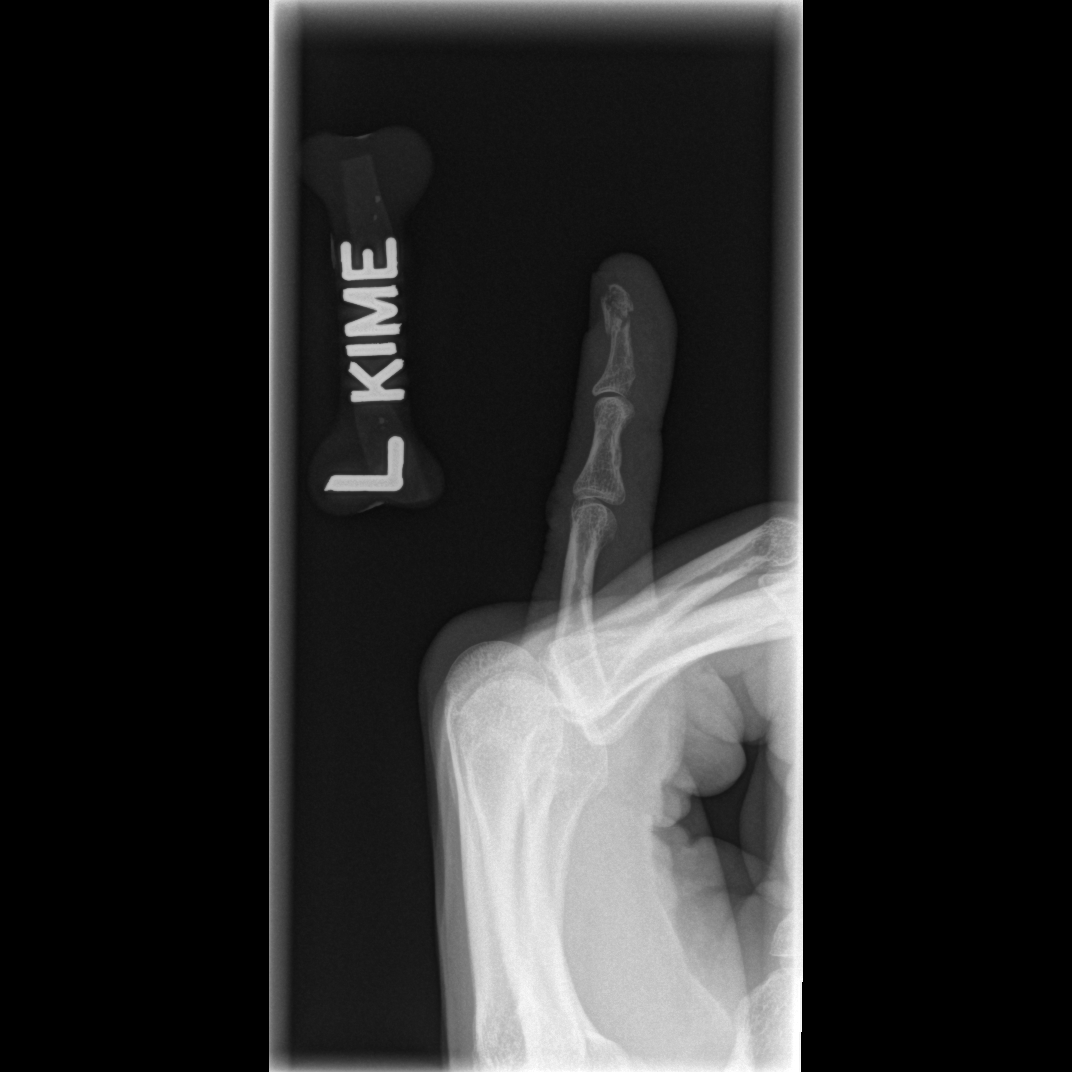

[3 of 3 positions shown; findings below may reference images not displayed]

FINDINGS: Comminuted tuft fracture of the fifth distal phalanx without
significant change in alignment. Minimal dorsal displacement of
distal fracture fragment. Not much bony bridging with readily
visible fracture lucencies.
IMPRESSION: No significant change in alignment of comminuted tuft fracture of
the fifth distal phalanx, without significant interval bony bridging
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
## 2020-07-29 ENCOUNTER — Ambulatory Visit (INDEPENDENT_AMBULATORY_CARE_PROVIDER_SITE_OTHER): Payer: Managed Care, Other (non HMO) | Admitting: Family Medicine

## 2020-07-29 ENCOUNTER — Other Ambulatory Visit: Payer: Self-pay

## 2020-07-29 ENCOUNTER — Ambulatory Visit: Payer: Self-pay

## 2020-07-29 ENCOUNTER — Encounter: Payer: Self-pay | Admitting: Family Medicine

## 2020-07-29 VITALS — BP 102/68 | HR 79 | Ht 68.0 in | Wt 164.2 lb

## 2020-07-29 DIAGNOSIS — M25511 Pain in right shoulder: Secondary | ICD-10-CM

## 2020-07-29 NOTE — Patient Instructions (Signed)
Thank you for coming in today.  I think you will continue to improve.   Continue home exercises.   Plan for nitro patches for about a total of 3 months.   Recheck with me as needed.   Next step is not good enough is usually MRI.  Let me know.

## 2020-11-24 ENCOUNTER — Encounter: Payer: Self-pay | Admitting: Physician Assistant

## 2020-11-24 ENCOUNTER — Telehealth: Payer: Managed Care, Other (non HMO) | Admitting: Physician Assistant

## 2020-11-24 DIAGNOSIS — B9689 Other specified bacterial agents as the cause of diseases classified elsewhere: Secondary | ICD-10-CM

## 2020-11-24 DIAGNOSIS — J0140 Acute pansinusitis, unspecified: Secondary | ICD-10-CM

## 2020-11-24 DIAGNOSIS — J208 Acute bronchitis due to other specified organisms: Secondary | ICD-10-CM | POA: Diagnosis not present

## 2020-11-24 MED ORDER — AMOXICILLIN-POT CLAVULANATE 875-125 MG PO TABS: 1.0000 | ORAL_TABLET | Freq: Two times a day (BID) | ORAL | 0 refills | Status: DC

## 2020-11-24 MED ORDER — PREDNISONE 10 MG (21) PO TBPK: ORAL_TABLET | ORAL | 0 refills | Status: DC

## 2020-11-24 NOTE — Patient Instructions (Signed)
 Sinusitis, Adult Sinusitis is soreness and swelling (inflammation) of your sinuses. Sinuses are hollow spaces in the bones around your face. They are located:  Around your eyes.  In the middle of your forehead.  Behind your nose.  In your cheekbones. Your sinuses and nasal passages are lined with a fluid called mucus. Mucus drains out of your sinuses. Swelling can trap mucus in your sinuses. This lets germs (bacteria, virus, or fungus) grow, which leads to infection. Most of the time, this condition is caused by a virus. What are the causes? This condition is caused by:  Allergies.  Asthma.  Germs.  Things that block your nose or sinuses.  Growths in the nose (nasal polyps).  Chemicals or irritants in the air.  Fungus (rare). What increases the risk? You are more likely to develop this condition if:  You have a weak body defense system (immune system).  You do a lot of swimming or diving.  You use nasal sprays too much.  You smoke. What are the signs or symptoms? The main symptoms of this condition are pain and a feeling of pressure around the sinuses. Other symptoms include:  Stuffy nose (congestion).  Runny nose (drainage).  Swelling and warmth in the sinuses.  Headache.  Toothache.  A cough that may get worse at night.  Mucus that collects in the throat or the back of the nose (postnasal drip).  Being unable to smell and taste.  Being very tired (fatigue).  A fever.  Sore throat.  Bad breath. How is this diagnosed? This condition is diagnosed based on:  Your symptoms.  Your medical history.  A physical exam.  Tests to find out if your condition is short-term (acute) or long-term (chronic). Your doctor may: ? Check your nose for growths (polyps). ? Check your sinuses using a tool that has a light (endoscope). ? Check for allergies or germs. ? Do imaging tests, such as an MRI or CT scan. How is this treated? Treatment for this  condition depends on the cause and whether it is short-term or long-term.  If caused by a virus, your symptoms should go away on their own within 10 days. You may be given medicines to relieve symptoms. They include: ? Medicines that shrink swollen tissue in the nose. ? Medicines that treat allergies (antihistamines). ? A spray that treats swelling of the nostrils. ? Rinses that help get rid of thick mucus in your nose (nasal saline washes).  If caused by bacteria, your doctor may wait to see if you will get better without treatment. You may be given antibiotic medicine if you have: ? A very bad infection. ? A weak body defense system.  If caused by growths in the nose, you may need to have surgery. Follow these instructions at home: Medicines  Take, use, or apply over-the-counter and prescription medicines only as told by your doctor. These may include nasal sprays.  If you were prescribed an antibiotic medicine, take it as told by your doctor. Do not stop taking the antibiotic even if you start to feel better. Hydrate and humidify  Drink enough water to keep your pee (urine) pale yellow.  Use a cool mist humidifier to keep the humidity level in your home above 50%.  Breathe in steam for 10-15 minutes, 3-4 times a day, or as told by your doctor. You can do this in the bathroom while a hot shower is running.  Try not to spend time in cool or dry air.     Rest  Rest as much as you can.  Sleep with your head raised (elevated).  Make sure you get enough sleep each night. General instructions  Put a warm, moist washcloth on your face 3-4 times a day, or as often as told by your doctor. This will help with discomfort.  Wash your hands often with soap and water. If there is no soap and water, use hand sanitizer.  Do not smoke. Avoid being around people who are smoking (secondhand smoke).  Keep all follow-up visits as told by your doctor. This is important.   Contact a doctor  if:  You have a fever.  Your symptoms get worse.  Your symptoms do not get better within 10 days. Get help right away if:  You have a very bad headache.  You cannot stop throwing up (vomiting).  You have very bad pain or swelling around your face or eyes.  You have trouble seeing.  You feel confused.  Your neck is stiff.  You have trouble breathing. Summary  Sinusitis is swelling of your sinuses. Sinuses are hollow spaces in the bones around your face.  This condition is caused by tissues in your nose that become inflamed or swollen. This traps germs. These can lead to infection.  If you were prescribed an antibiotic medicine, take it as told by your doctor. Do not stop taking it even if you start to feel better.  Keep all follow-up visits as told by your doctor. This is important. This information is not intended to replace advice given to you by your health care provider. Make sure you discuss any questions you have with your health care provider. Document Revised: 12/19/2017 Document Reviewed: 12/19/2017 Elsevier Patient Education  2021 Elsevier Inc.  Acute Bronchitis, Adult  Acute bronchitis is when air tubes in the lungs (bronchi) suddenly get swollen. The condition can make it hard for you to breathe. In adults, acute bronchitis usually goes away within 2 weeks. A cough caused by bronchitis may last up to 3 weeks. Smoking, allergies, and asthma can make the condition worse. What are the causes? This condition is caused by:  Cold and flu viruses. The most common cause of this condition is the virus that causes the common cold.  Bacteria.  Substances that irritate the lungs, including: ? Smoke from cigarettes and other types of tobacco. ? Dust and pollen. ? Fumes from chemicals, gases, or burned fuel. ? Other materials that pollute indoor or outdoor air.  Close contact with someone who has acute bronchitis. What increases the risk? The following factors may  make you more likely to develop this condition:  A weak body's defense system. This is also called the immune system.  Any condition that affects your lungs and breathing, such as asthma. What are the signs or symptoms? Symptoms of this condition include:  A cough.  Coughing up clear, yellow, or green mucus.  Wheezing.  Chest congestion.  Shortness of breath.  A fever.  Body aches.  Chills.  A sore throat. How is this treated? Acute bronchitis may go away over time without treatment. Your doctor may recommend:  Drinking more fluids.  Taking a medicine for a fever or cough.  Using a device that gets medicine into your lungs (inhaler).  Using a vaporizer or a humidifier. These are machines that add water or moisture in the air to help with coughing and poor breathing. Follow these instructions at home: Activity  Get a lot of rest.  Avoid places where   there are fumes from chemicals.  Return to your normal activities as told by your doctor. Ask your doctor what activities are safe for you. Lifestyle  Drink enough fluids to keep your pee (urine) pale yellow.  Do not drink alcohol.  Do not use any products that contain nicotine or tobacco, such as cigarettes, e-cigarettes, and chewing tobacco. If you need help quitting, ask your doctor. Be aware that: ? Your bronchitis will get worse if you smoke or breathe in other people's smoke (secondhand smoke). ? Your lungs will heal faster if you quit smoking. General instructions  Take over-the-counter and prescription medicines only as told by your doctor.  Use an inhaler, cool mist vaporizer, or humidifier as told by your doctor.  Rinse your mouth often with salt water. To make salt water, dissolve -1 tsp (3-6 g) of salt in 1 cup (237 mL) of warm water.  Keep all follow-up visits as told by your doctor. This is important.   How is this prevented? To lower your risk of getting this condition again:  Wash your hands  often with soap and water. If soap and water are not available, use hand sanitizer.  Avoid contact with people who have cold symptoms.  Try not to touch your mouth, nose, or eyes with your hands.  Make sure to get the flu shot every year.   Contact a doctor if:  Your symptoms do not get better in 2 weeks.  You vomit more than once or twice.  You have symptoms of loss of fluid from your body (dehydration). These include: ? Dark urine. ? Dry skin or eyes. ? Increased thirst. ? Headaches. ? Confusion. ? Muscle cramps. Get help right away if:  You cough up blood.  You have chest pain.  You have very bad shortness of breath.  You become dehydrated.  You faint or keep feeling like you are going to faint.  You keep vomiting.  You have a very bad headache.  Your fever or chills get worse. These symptoms may be an emergency. Do not wait to see if the symptoms will go away. Get medical help right away. Call your local emergency services (911 in the [place] Do not drive yourself to the hospital. Summary  Acute bronchitis is when air tubes in the lungs (bronchi) suddenly get swollen. In adults, acute bronchitis usually goes away within 2 weeks.  Take over-the-counter and prescription medicines only as told by your doctor.  Drink enough fluid to keep your pee (urine) pale yellow.  Contact a doctor if your symptoms do not improve after 2 weeks of treatment.  Get help right away if you cough up blood, faint, or have chest pain or shortness of breath. This information is not intended to replace advice given to you by your health care provider. Make sure you discuss any questions you have with your health care provider. Document Revised: 02/09/2019 Document Reviewed: 02/09/2019 Elsevier Patient Education  2021 Elsevier Inc.  

## 2020-11-24 NOTE — Progress Notes (Signed)
Erik Mayer, kettles are scheduled for a virtual visit with your provider today.    Just as we do with appointments in the office, we must obtain your consent to participate.  Your consent will be active for this visit and any virtual visit you may have with one of our providers in the next 365 days.    If you have a MyChart account, I can also send a copy of this consent to you electronically.  All virtual visits are billed to your insurance company just like a traditional visit in the office.  As this is a virtual visit, video technology does not allow for your provider to perform a traditional examination.  This may limit your provider's ability to fully assess your condition.  If your provider identifies any concerns that need to be evaluated in person or the need to arrange testing such as labs, EKG, etc, we will make arrangements to do so.    Although advances in technology are sophisticated, we cannot ensure that it will always work on either your end or our end.  If the connection with a video visit is poor, we may have to switch to a telephone visit.  With either a video or telephone visit, we are not always able to ensure that we have a secure connection.   I need to obtain your verbal consent now.   Are you willing to proceed with your visit today?   Erik Mayer has provided verbal consent on 11/24/2020 for a virtual visit (video or telephone).   Erik Loveless, PA-C 11/24/2020  8:25 AM     MyChart Video Visit    Virtual Visit via Video Note   This visit type was conducted due to national recommendations for restrictions regarding the COVID-19 Pandemic (e.g. social distancing) in an effort to limit this patient's exposure and mitigate transmission in our community. This patient is at least at moderate risk for complications without adequate follow up. This format is felt to be most appropriate for this patient at this time. Physical exam was limited by quality of the video and  audio technology used for the visit.   Patient location: Home Provider location: Home office in [place]  I discussed the limitations of evaluation and management by telemedicine and the availability of in person appointments. The patient expressed understanding and agreed to proceed.  Patient: Erik Mayer   DOB: 1981/02/15   40 y.o. Male  MRN: [id number] Visit Date: 11/24/2020  Today's healthcare provider: Margaretann Loveless, PA-C   No chief complaint on file.  Subjective    URI  This is a new problem. The current episode started 1 to 4 weeks ago. The problem has been gradually worsening. There has been no fever. Associated symptoms include congestion, coughing (green), ear pain (just started this morning), headaches, sinus pain and a sore throat. Pertinent negatives include no abdominal pain, chest pain, nausea, rhinorrhea or wheezing. He has tried acetaminophen, decongestant, sleep and increased fluids for the symptoms. The treatment provided no relief.    Patient Active Problem List   Diagnosis Date Noted  . Bilateral shoulder pain 08/08/2019  . Closed fracture of tuft of distal phalanx of finger 07/11/2019  . Tendonitis, Achilles, right 04/23/2015   Past Medical History:  Diagnosis Date  . Allergy       Medications: Outpatient Medications Prior to Visit  Medication Sig  . nitroGLYCERIN (NITRODUR - DOSED IN MG/24 HR) 0.2 mg/hr patch Apply 1/4 patch daily to tendon for tendonitis.  Marland Kitchen  triamcinolone cream (KENALOG) 0.1 % APPLY TO AFFECTED AREA TWICE A DAY   No facility-administered medications prior to visit.    Review of Systems  Constitutional: Positive for chills (one day), fatigue and fever (one day while on vacation).  HENT: Positive for congestion, ear pain (just started this morning), sinus pressure, sinus pain, sore throat, trouble swallowing and voice change. Negative for ear discharge, postnasal drip and rhinorrhea.   Eyes: Positive for redness.   Respiratory: Positive for cough (green). Negative for chest tightness, shortness of breath and wheezing.   Cardiovascular: Negative for chest pain and leg swelling.  Gastrointestinal: Negative for abdominal pain and nausea.  Neurological: Positive for headaches. Negative for dizziness.    Last CBC Lab Results  Component Value Date   WBC 4.1 02/28/2020   HGB 14.9 02/28/2020   HCT 44.6 02/28/2020   MCV 86.0 02/28/2020   RDW 13.3 02/28/2020   PLT 151.0 02/28/2020   Last metabolic panel Lab Results  Component Value Date   GLUCOSE 89 02/28/2020   NA 140 02/28/2020   K 4.5 02/28/2020   CL 107 02/28/2020   CO2 29 02/28/2020   BUN 22 02/28/2020   CREATININE 1.13 02/28/2020   CALCIUM 9.1 02/28/2020   PROT 6.5 02/28/2020   ALBUMIN 4.2 02/28/2020   BILITOT 0.4 02/28/2020   ALKPHOS 32 (L) 02/28/2020   AST 32 02/28/2020   ALT 31 02/28/2020      Objective    There were no vitals taken for this visit. BP Readings from Last 3 Encounters:  07/29/20 102/68  06/12/20 110/70  02/28/20 102/70   Wt Readings from Last 3 Encounters:  07/29/20 164 lb 3.2 oz (74.5 kg)  06/12/20 165 lb 3.2 oz (74.9 kg)  02/28/20 157 lb (71.2 kg)      Physical Exam Vitals reviewed.  Constitutional:      General: He is not in acute distress.    Appearance: Normal appearance. He is well-developed. He is ill-appearing.  HENT:     Head: Normocephalic and atraumatic.  Eyes:     Conjunctiva/sclera:     Right eye: Right conjunctiva is injected.  Pulmonary:     Effort: Pulmonary effort is normal. No respiratory distress (dry cough heard but no difficulty speaking in complete sentences).  Musculoskeletal:     Cervical back: Normal range of motion and neck supple.  Neurological:     Mental Status: He is alert.  Psychiatric:        Mood and Affect: Mood normal.        Behavior: Behavior normal.        Thought Content: Thought content normal.        Judgment: Judgment normal.        Assessment &  Plan     1. Acute non-recurrent pansinusitis Worsening symptoms that have not responded to OTC medications. Will give augmentin as below. Continue allergy medications. Stay well hydrated and get plenty of rest. Follow up in person if no symptom improvement or if symptoms worsen. - amoxicillin-clavulanate (AUGMENTIN) 875-125 MG tablet; Take 1 tablet by mouth 2 (two) times daily.  Dispense: 20 tablet; Refill: 0  2. Acute bacterial bronchitis Prednisone given for possible bronchitis. - predniSONE (STERAPRED UNI-PAK 21 TAB) 10 MG (21) TBPK tablet; 6 day taper; take as directed on package instructions  Dispense: 21 tablet; Refill: 0   No follow-ups on file.     I discussed the assessment and treatment plan with the patient. The patient was provided an  opportunity to ask questions and all were answered. The patient agreed with the plan and demonstrated an understanding of the instructions.   The patient was advised to call back or seek an in-person evaluation if the symptoms worsen or if the condition fails to improve as anticipated.  I provided 13 minutes of face-to-face time during this encounter via MyChart Video enabled encounter.   Reine Just [facility] [phone] (phone) [phone] (fax)  [facility]

## 2021-07-21 ENCOUNTER — Other Ambulatory Visit: Payer: Self-pay | Admitting: Medical

## 2021-10-08 ENCOUNTER — Telehealth: Payer: Self-pay | Admitting: Medical

## 2021-10-08 NOTE — Telephone Encounter (Signed)
Pt requesting TOC from Percell Miller to Judson Roch ? ?OK to schedule? ?

## 2021-10-13 NOTE — Telephone Encounter (Signed)
LVM for pt to call office to get toc appt scheduled ?

## 2021-10-29 ENCOUNTER — Ambulatory Visit: Payer: Managed Care, Other (non HMO) | Admitting: Family Medicine

## 2021-10-29 ENCOUNTER — Encounter: Payer: Self-pay | Admitting: Family Medicine

## 2021-10-29 VITALS — BP 114/72 | HR 68 | Temp 97.8°F | Ht 68.0 in | Wt 166.4 lb

## 2021-10-29 DIAGNOSIS — S46211A Strain of muscle, fascia and tendon of other parts of biceps, right arm, initial encounter: Secondary | ICD-10-CM | POA: Diagnosis not present

## 2021-10-29 DIAGNOSIS — R21 Rash and other nonspecific skin eruption: Secondary | ICD-10-CM

## 2021-10-29 DIAGNOSIS — Z7689 Persons encountering health services in other specified circumstances: Secondary | ICD-10-CM

## 2021-10-29 MED ORDER — DICLOFENAC SODIUM 1 % EX GEL: 2.0000 g | Freq: Four times a day (QID) | CUTANEOUS | 1 refills | Status: DC
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
## 2021-10-29 NOTE — Patient Instructions (Addendum)
? ?  Dermatology offices ? ?[facility]: Phone #: [phone] ?Address: [address], [place], [place] [address] ? ?[facility]: Phone: [phone] ? Address: [address] [place], [place] [address] ? ?[facility]: [phone] ?Address: [address]  ?[place], [place]  ? ?[facility] ?Address: [address] [place], [place], [place] [address] ?Phone: [phone]  ? ?It was a pleasure meeting you today.  ? ?You can try the Voltaren gel as needed but not prior to workout.  ? ?I recommend following up with Dr. Earma Reading for further evaluation and treatment.  ? ?

## 2022-04-01 ENCOUNTER — Encounter: Payer: Self-pay | Admitting: Family Medicine

## 2022-04-01 ENCOUNTER — Ambulatory Visit (INDEPENDENT_AMBULATORY_CARE_PROVIDER_SITE_OTHER): Payer: 59 | Admitting: Family Medicine

## 2022-04-01 ENCOUNTER — Other Ambulatory Visit (INDEPENDENT_AMBULATORY_CARE_PROVIDER_SITE_OTHER): Payer: 59

## 2022-04-01 VITALS — BP 110/78 | HR 70 | Temp 97.6°F | Ht 68.0 in | Wt 162.0 lb

## 2022-04-01 DIAGNOSIS — F439 Reaction to severe stress, unspecified: Secondary | ICD-10-CM | POA: Diagnosis not present

## 2022-04-01 DIAGNOSIS — F410 Panic disorder [episodic paroxysmal anxiety] without agoraphobia: Secondary | ICD-10-CM

## 2022-04-01 HISTORY — DX: Panic disorder (episodic paroxysmal anxiety): F41.0

## 2022-04-01 LAB — CBC WITH DIFFERENTIAL/PLATELET
Basophils Absolute: 0 10*3/uL (ref 0.0–0.1)
Basophils Relative: 0.4 % (ref 0.0–3.0)
Eosinophils Absolute: 0.1 10*3/uL (ref 0.0–0.7)
Eosinophils Relative: 1.6 % (ref 0.0–5.0)
HCT: 47 % (ref 39.0–52.0)
Hemoglobin: 16 g/dL (ref 13.0–17.0)
Lymphocytes Relative: 31.2 % (ref 12.0–46.0)
Lymphs Abs: 1.9 10*3/uL (ref 0.7–4.0)
MCHC: 34.1 g/dL (ref 30.0–36.0)
MCV: 85.6 fl (ref 78.0–100.0)
Monocytes Absolute: 0.6 10*3/uL (ref 0.1–1.0)
Monocytes Relative: 10.1 % (ref 3.0–12.0)
Neutro Abs: 3.4 10*3/uL (ref 1.4–7.7)
Neutrophils Relative %: 56.7 % (ref 43.0–77.0)
Platelets: 162 10*3/uL (ref 150.0–400.0)
RBC: 5.49 Mil/uL (ref 4.22–5.81)
RDW: 13 % (ref 11.5–15.5)
WBC: 5.9 10*3/uL (ref 4.0–10.5)

## 2022-04-01 MED ORDER — ALPRAZOLAM 0.25 MG PO TABS: 0.2500 mg | ORAL_TABLET | Freq: Two times a day (BID) | ORAL | 0 refills | Status: DC | PRN

## 2022-04-01 NOTE — Progress Notes (Signed)
Subjective:     Patient ID: Erik Mayer, male    DOB: April 18, 1981, 40 y.o.   MRN: [id number]  Chief Complaint  Patient presents with   Panic Attack    Last night had panic attack due to job stress    HPI Patient is in today with complaint of having a panic attack last night. States he was watching a tv show which triggerd I'm to think about work and all of his responsibilities and he started having a fast heart rate, sweating, rapid breathing and it took some time to calm down. States his wife helped him with breathing and distraction techniques.   States he feels much better today. States he went to work and was productive and he felt better about it all.   Denies hx of anxiety or panic attacks.   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 He is not interested in seeing anyone about this currently.   No fever, chills, dizziness, chest pain, shortness of breath, abdominal pain, N/V/D.      Health Maintenance Due  Topic Date Due   Hepatitis C Screening  Never done    Past Medical History:  Diagnosis Date   Allergy     Past Surgical History:  Procedure Laterality Date   TONSILLECTOMY  2006    Family History  Problem Relation Age of Onset   Cancer Cousin     Social History   Socioeconomic History   Marital status: Married    Spouse name: Not on file   Number of children: Not on file   Years of education: Not on file   Highest education level: Not on file  Occupational History   Not on file  Tobacco Use   Smoking status: Never   Smokeless tobacco: Never  Substance and Sexual Activity   Alcohol use: Yes    Alcohol/week: 0.0 standard drinks of alcohol    Comment: occasionally. 5 drinks a week.   Drug use: No   Sexual activity: Yes  Other Topics Concern   Not on file  Social History Narrative   Not on file   Social Determinants of Health   Financial Resource Strain: Not on file  Food Insecurity: Not on  file  Transportation Needs: Not on file  Physical Activity: Not on file  Stress: Not on file  Social Connections: Not on file  Intimate Partner Violence: Not on file    Outpatient Medications Prior to Visit  Medication Sig Dispense Refill   augmented betamethasone dipropionate (DIPROLENE-AF) 0.05 % ointment Apply 1 Application topically 2 (two) times daily.     diclofenac Sodium (VOLTAREN) 1 % GEL Apply 2 g topically 4 (four) times daily. 100 g 1   triamcinolone cream (KENALOG) 0.1 % APPLY TO AFFECTED AREA TWICE A DAY 30 g 1   No facility-administered medications prior to visit.    No Known Allergies  ROS     Objective:    Physical Exam Constitutional:      General: He is not in acute distress.    Appearance: He is not ill-appearing.  Eyes:     Conjunctiva/sclera: Conjunctivae normal.     Pupils: Pupils are equal, round, and reactive to light.  Cardiovascular:     Rate and Rhythm: Normal rate.  Pulmonary:     Effort: Pulmonary effort is normal.  Musculoskeletal:     Cervical back: Normal range of motion.  Neurological:  General: No focal deficit present.     Mental Status: He is alert and oriented to person, place, and time.  Psychiatric:        Mood and Affect: Mood normal.        Behavior: Behavior normal.        Thought Content: Thought content normal.     BP 110/78 (BP Location: Left Arm, Patient Position: Sitting, Cuff Size: Large)   Pulse 70   Temp 97.6 F (36.4 C) (Temporal)   Ht 5\' 8"  (1.727 m)   Wt 162 lb (73.5 kg)   SpO2 98%   BMI 24.63 kg/m  Wt Readings from Last 3 Encounters:  04/01/22 162 lb (73.5 kg)  10/29/21 166 lb 6.4 oz (75.5 kg)  07/29/20 164 lb 3.2 oz (74.5 kg)       Assessment & Plan:   Problem List Items Addressed This Visit   None Visit Diagnoses     Panic attacks    -  Primary   Relevant Medications   ALPRAZolam (XANAX) 0.25 MG tablet   Other Relevant Orders   CBC with Differential/Platelet   Comprehensive metabolic  panel   TSH   T4, free   Stress       Relevant Medications   ALPRAZolam (XANAX) 0.25 MG tablet      Recommend seeing a counselor. Alprazolam prescribed for short term use. Discussed distraction techniques.  Will check labs and follow up in 2 wks  I am having 07/31/20 start on ALPRAZolam. I am also having him maintain his triamcinolone cream, diclofenac Sodium, and augmented betamethasone dipropionate.  Meds ordered this encounter  Medications   ALPRAZolam (XANAX) 0.25 MG tablet    Sig: Take 1 tablet (0.25 mg total) by mouth 2 (two) times daily as needed for anxiety.    Dispense:  20 tablet    Refill:  0    Order Specific Question:   Supervising Provider    Answer:   Ardyth Harps A [[id number]]

## 2022-04-02 LAB — COMPREHENSIVE METABOLIC PANEL
ALT: 21 U/L (ref 0–53)
AST: 23 U/L (ref 0–37)
Albumin: 4.5 g/dL (ref 3.5–5.2)
Alkaline Phosphatase: 28 U/L — ABNORMAL LOW (ref 39–117)
BUN: 29 mg/dL — ABNORMAL HIGH (ref 6–23)
CO2: 22 mEq/L (ref 19–32)
Calcium: 9.4 mg/dL (ref 8.4–10.5)
Chloride: 106 mEq/L (ref 96–112)
Creatinine, Ser: 1.27 mg/dL (ref 0.40–1.50)
GFR: 69.93 mL/min (ref >60.00)
Glucose, Bld: 86 mg/dL (ref 70–99)
Potassium: 4.2 mEq/L (ref 3.5–5.1)
Sodium: 140 mEq/L (ref 135–145)
Total Bilirubin: 0.4 mg/dL (ref 0.2–1.2)
Total Protein: 7.1 g/dL (ref 6.0–8.3)

## 2022-04-02 LAB — T4, FREE: Free T4: 1.04 ng/dL (ref 0.60–1.60)

## 2022-04-02 LAB — TSH: TSH: 1.64 u[IU]/mL (ref 0.35–5.50)

## 2022-04-21 ENCOUNTER — Telehealth (INDEPENDENT_AMBULATORY_CARE_PROVIDER_SITE_OTHER): Payer: 59 | Admitting: Family Medicine

## 2022-04-21 DIAGNOSIS — F439 Reaction to severe stress, unspecified: Secondary | ICD-10-CM

## 2022-04-21 DIAGNOSIS — F410 Panic disorder [episodic paroxysmal anxiety] without agoraphobia: Secondary | ICD-10-CM | POA: Diagnosis not present

## 2022-04-21 NOTE — Progress Notes (Signed)
MyChart Video Visit    Virtual Visit via Video Note   This visit type was conducted due to national recommendations for restrictions regarding the COVID-19 Pandemic (e.g. social distancing) in an effort to limit this patient's exposure and mitigate transmission in our community. This patient is at least at moderate risk for complications without adequate follow up. This format is felt to be most appropriate for this patient at this time. Physical exam was limited by quality of the video and audio technology used for the visit. CMA was able to get the patient set up on a video visit.  Patient location: Home. Patient and provider in visit Provider location: Office  I discussed the limitations of evaluation and management by telemedicine and the availability of in person appointments. The patient expressed understanding and agreed to proceed.  Visit Date: 04/21/2022  Today's healthcare provider: Harland Dingwall, NP-C     Subjective:    Patient ID: Erik Mayer, male    DOB: 1981/07/19, 41 y.o.   MRN: [id number]  Chief Complaint  Patient presents with   Follow-up    2 week f/u     HPI This is a 2 wk f/u for panic attacks and anxiety.  Having a lot of work stress.   He has taken alprazolam 4-5 tablets. He has not taken it in the past 3-4 days.   He saw a therapist for a visit and has an upcoming appt as well.   Feels like current work stress is not his norm and things will calm down.   No new concerns.      Past Medical History:  Diagnosis Date   Allergy    Panic attacks 04/01/2022    Past Surgical History:  Procedure Laterality Date   TONSILLECTOMY  2006    Family History  Problem Relation Age of Onset   Cancer Cousin     Social History   Socioeconomic History   Marital status: Married    Spouse name: Not on file   Number of children: Not on file   Years of education: Not on file   Highest education level: Not on file  Occupational History    Not on file  Tobacco Use   Smoking status: Never   Smokeless tobacco: Never  Substance and Sexual Activity   Alcohol use: Yes    Alcohol/week: 0.0 standard drinks of alcohol    Comment: occasionally. 5 drinks a week.   Drug use: No   Sexual activity: Yes  Other Topics Concern   Not on file  Social History Narrative   Not on file   Social Determinants of Health   Financial Resource Strain: Not on file  Food Insecurity: Not on file  Transportation Needs: Not on file  Physical Activity: Not on file  Stress: Not on file  Social Connections: Not on file  Intimate Partner Violence: Not on file    Outpatient Medications Prior to Visit  Medication Sig Dispense Refill   ALPRAZolam (XANAX) 0.25 MG tablet Take 1 tablet (0.25 mg total) by mouth 2 (two) times daily as needed for anxiety. 20 tablet 0   augmented betamethasone dipropionate (DIPROLENE-AF) 0.05 % ointment Apply 1 Application topically 2 (two) times daily.     diclofenac Sodium (VOLTAREN) 1 % GEL Apply 2 g topically 4 (four) times daily. 100 g 1   triamcinolone cream (KENALOG) 0.1 % APPLY TO AFFECTED AREA TWICE A DAY 30 g 1   No facility-administered medications prior to visit.  No Known Allergies  ROS     Objective:    Physical Exam  There were no vitals taken for this visit. Wt Readings from Last 3 Encounters:  04/01/22 162 lb (73.5 kg)  10/29/21 166 lb 6.4 oz (75.5 kg)  07/29/20 164 lb 3.2 oz (74.5 kg)   Alert and oriented and in no acute distress.  Respirations unlabored.  Normal mood.     Assessment & Plan:   Problem List Items Addressed This Visit       Other   Panic attacks - Primary   Stress   He is seeing a therapist now for stress and panic attacks.  Using alprazolam sparingly.  We discussed the option of starting him on a SSRI such as Zoloft, Lexapro or Celexa if he has persistent issues. He will let me know.   I am having [name] maintain his triamcinolone cream, diclofenac  Sodium, augmented betamethasone dipropionate, and ALPRAZolam.  No orders of the defined types were placed in this encounter.   I discussed the assessment and treatment plan with the patient. The patient was provided an opportunity to ask questions and all were answered. The patient agreed with the plan and demonstrated an understanding of the instructions.   The patient was advised to call back or seek an in-person evaluation if the symptoms worsen or if the condition fails to improve as anticipated.  I provided 12 minutes of face-to-face time during this encounter.   Harland Dingwall, NP-C [facility] [place] [phone] (phone) [phone] (fax)  [place]

## 2022-07-14 ENCOUNTER — Ambulatory Visit (INDEPENDENT_AMBULATORY_CARE_PROVIDER_SITE_OTHER): Payer: 59 | Admitting: Family Medicine

## 2022-07-14 ENCOUNTER — Encounter: Payer: Self-pay | Admitting: Family Medicine

## 2022-07-14 VITALS — BP 108/72 | HR 70 | Temp 97.8°F | Ht 68.0 in | Wt 173.0 lb

## 2022-07-14 DIAGNOSIS — F32A Depression, unspecified: Secondary | ICD-10-CM | POA: Diagnosis not present

## 2022-07-14 DIAGNOSIS — F410 Panic disorder [episodic paroxysmal anxiety] without agoraphobia: Secondary | ICD-10-CM | POA: Diagnosis not present

## 2022-07-14 DIAGNOSIS — F419 Anxiety disorder, unspecified: Secondary | ICD-10-CM

## 2022-07-14 DIAGNOSIS — F439 Reaction to severe stress, unspecified: Secondary | ICD-10-CM

## 2022-07-14 HISTORY — DX: Depression, unspecified: F32.A

## 2022-07-14 MED ORDER — ALPRAZOLAM 0.25 MG PO TABS: 0.2500 mg | ORAL_TABLET | Freq: Two times a day (BID) | ORAL | 0 refills | Status: DC | PRN

## 2022-07-14 MED ORDER — ESCITALOPRAM OXALATE 5 MG PO TABS: 5.0000 mg | ORAL_TABLET | Freq: Every day | ORAL | 1 refills | Status: DC

## 2022-07-14 NOTE — Progress Notes (Signed)
Subjective:     Patient ID: Erik Mayer, male    DOB: Jan 27, 1981, 41 y.o.   MRN: [id number]  Chief Complaint  Patient presents with   Anxiety   Stress    States he has on and off days, last week was very bad     HPI Patient is in today for worsening anxiety and depression.  Recent panic attacks (last week) which were fairly debilitating.   Seeing a therapist. Taking alprazolam sparingly.    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 Looking for something to suit him better.   Denies SI. Denies hallucinations.  He is not self medicating.   Denies fever, chills, dizziness, chest pain, palpitations, shortness of breath, abdominal pain.         04/01/2022    4:21 PM 10/29/2021   11:37 AM 10/27/2016    8:00 AM  Depression screen PHQ 2/9  Decreased Interest 0 0 0  Down, Depressed, Hopeless 1 0 0  PHQ - 2 Score 1 0 0  Altered sleeping 1    Tired, decreased energy 0    Change in appetite 0    Feeling bad or failure about yourself  1    Trouble concentrating 0    Moving slowly or fidgety/restless 1    Suicidal thoughts 0    PHQ-9 Score 4    Difficult doing work/chores Not difficult at all         There are no preventive care reminders to display for this patient.  Past Medical History:  Diagnosis Date   Allergy    Anxiety and depression 07/14/2022   Panic attacks 04/01/2022    Past Surgical History:  Procedure Laterality Date   TONSILLECTOMY  2006    Family History  Problem Relation Age of Onset   Cancer Cousin     Social History   Socioeconomic History   Marital status: Married    Spouse name: Not on file   Number of children: Not on file   Years of education: Not on file   Highest education level: Not on file  Occupational History   Not on file  Tobacco Use   Smoking status: Never   Smokeless tobacco: Never  Substance and Sexual Activity   Alcohol use: Yes    Alcohol/week: 0.0 standard  drinks of alcohol    Comment: occasionally. 5 drinks a week.   Drug use: No   Sexual activity: Yes  Other Topics Concern   Not on file  Social History Narrative   Not on file   Social Determinants of Health   Financial Resource Strain: Not on file  Food Insecurity: Not on file  Transportation Needs: Not on file  Physical Activity: Not on file  Stress: Not on file  Social Connections: Not on file  Intimate Partner Violence: Not on file    Outpatient Medications Prior to Visit  Medication Sig Dispense Refill   augmented betamethasone dipropionate (DIPROLENE-AF) 0.05 % ointment Apply 1 Application topically 2 (two) times daily.     diclofenac Sodium (VOLTAREN) 1 % GEL Apply 2 g topically 4 (four) times daily. 100 g 1   ALPRAZolam (XANAX) 0.25 MG tablet Take 1 tablet (0.25 mg total) by mouth 2 (two) times daily as needed for anxiety. 20 tablet 0   triamcinolone cream (KENALOG) 0.1 % APPLY TO AFFECTED AREA TWICE A DAY 30 g 1   No facility-administered medications prior to visit.  No Known Allergies  ROS     Objective:    Physical Exam Constitutional:      General: He is not in acute distress.    Appearance: He is not ill-appearing.  Cardiovascular:     Rate and Rhythm: Normal rate.  Pulmonary:     Effort: Pulmonary effort is normal.  Neurological:     General: No focal deficit present.     Mental Status: He is alert and oriented to person, place, and time.  Psychiatric:        Attention and Perception: Attention normal.        Mood and Affect: Mood and affect normal.        Speech: Speech normal.        Behavior: Behavior normal.        Thought Content: Thought content normal. Thought content does not include homicidal or suicidal ideation.     BP 108/72 (BP Location: Left Arm, Patient Position: Sitting, Cuff Size: Large)   Pulse 70   Temp 97.8 F (36.6 C) (Temporal)   Ht 5\' 8"  (1.727 m)   Wt 173 lb (78.5 kg)   SpO2 100%   BMI 26.30 kg/m  Wt Readings  from Last 3 Encounters:  07/14/22 173 lb (78.5 kg)  04/01/22 162 lb (73.5 kg)  10/29/21 166 lb 6.4 oz (75.5 kg)       Assessment & Plan:   Problem List Items Addressed This Visit       Other   Anxiety and depression - Primary   Relevant Medications   escitalopram (LEXAPRO) 5 MG tablet   ALPRAZolam (XANAX) 0.25 MG tablet   Panic attacks   Relevant Medications   escitalopram (LEXAPRO) 5 MG tablet   ALPRAZolam (XANAX) 0.25 MG tablet   Stress   Relevant Medications   ALPRAZolam (XANAX) 0.25 MG tablet   He does not appear to be in any danger to himself. No thought of self harm.  Start Lexapro 5 mg but will start 1/2 tablet for the first 4-5 days and then increase to the full tablet if he is doing well.  Refilled alprazolam, he is taking this appropriately.  Avoid any mind altering substances while getting used to SSRI.  Continue seeing therapist.  Reviewed labs from August with him which were fine.  Follow up with me via mycart in 2 wks and in office in 4 wks.   Visit time 30 minutes in face to face communication with patient and coordination of care, additional 5 minutes spent in record review, coordination or care, ordering tests, communicating/referring to other healthcare professionals, documenting in medical records all on the same day of the visit for total time 35 minutes spent on the visit.    I have discontinued September Hitchman's triamcinolone cream. I am also having him start on escitalopram. Additionally, I am having him maintain his diclofenac Sodium, augmented betamethasone dipropionate, and ALPRAZolam.  Meds ordered this encounter  Medications   escitalopram (LEXAPRO) 5 MG tablet    Sig: Take 1 tablet (5 mg total) by mouth at bedtime.    Dispense:  30 tablet    Refill:  1    Order Specific Question:   Supervising Provider    Answer:   Cristal Deer A [[id number]]   ALPRAZolam (XANAX) 0.25 MG tablet    Sig: Take 1 tablet (0.25 mg total) by mouth 2 (two)  times daily as needed for anxiety.    Dispense:  20 tablet    Refill:  0    Order Specific Question:   Supervising Provider    Answer:   Hillard Danker A [[id number]]

## 2022-07-14 NOTE — Patient Instructions (Addendum)
Start on Lexapro 1/2 tablet nightly x 4-5 nights and then increase to the full tablet if you are doing fine.  Let me know via mychart how you are doing on the medication in 2 weeks.   Continue talking with your therapist.   Avoid mind altering substances while you are seeing if this medication is helpful.   Use the alprazolam when needed.   Follow up in 4 weeks.

## 2022-07-28 ENCOUNTER — Encounter: Payer: Self-pay | Admitting: Family Medicine

## 2022-07-28 ENCOUNTER — Other Ambulatory Visit: Payer: Self-pay | Admitting: Family Medicine

## 2022-07-28 DIAGNOSIS — F419 Anxiety disorder, unspecified: Secondary | ICD-10-CM

## 2022-07-28 DIAGNOSIS — F410 Panic disorder [episodic paroxysmal anxiety] without agoraphobia: Secondary | ICD-10-CM

## 2022-07-28 NOTE — Progress Notes (Unsigned)
   Subjective:    Patient ID: Erik Mayer, male    DOB: January 26, 1981, 41 y.o.   MRN: [id number]  HPI    Review of Systems     Objective:   Physical Exam        Assessment & Plan:

## 2022-08-05 ENCOUNTER — Other Ambulatory Visit: Payer: Self-pay | Admitting: Family Medicine

## 2022-08-05 DIAGNOSIS — F419 Anxiety disorder, unspecified: Secondary | ICD-10-CM

## 2022-08-05 DIAGNOSIS — F410 Panic disorder [episodic paroxysmal anxiety] without agoraphobia: Secondary | ICD-10-CM

## 2022-08-05 MED ORDER — ESCITALOPRAM OXALATE 10 MG PO TABS: ORAL_TABLET | ORAL | 2 refills | Status: DC

## 2022-08-05 NOTE — Telephone Encounter (Signed)
30 or 90 day supply?

## 2022-08-06 ENCOUNTER — Encounter: Payer: Self-pay | Admitting: Family Medicine

## 2022-08-06 ENCOUNTER — Ambulatory Visit (INDEPENDENT_AMBULATORY_CARE_PROVIDER_SITE_OTHER): Payer: 59 | Admitting: Family Medicine

## 2022-08-06 VITALS — BP 102/72 | HR 72 | Temp 97.8°F | Ht 68.0 in | Wt 173.0 lb

## 2022-08-06 DIAGNOSIS — F410 Panic disorder [episodic paroxysmal anxiety] without agoraphobia: Secondary | ICD-10-CM | POA: Diagnosis not present

## 2022-08-06 DIAGNOSIS — F419 Anxiety disorder, unspecified: Secondary | ICD-10-CM | POA: Diagnosis not present

## 2022-08-06 DIAGNOSIS — F32A Depression, unspecified: Secondary | ICD-10-CM

## 2022-08-06 NOTE — Progress Notes (Signed)
Subjective:     Patient ID: Erik Mayer, male    DOB: [birth year], 42 y.o.   MRN: [id number]  Chief Complaint  Patient presents with   Anxiety    F/u on anxiety medication, feeling the medication is getting better    HPI Patient is in today for follow up on anxiety and depression. Taking Lexapro 10 mg daily. Reports improvement in symptoms. Has not needed alprazolam.  Seeing a therapist.   He has noticed some issues with sexual function. Taking longer to achieve orgasm.   No other concerns.     Health Maintenance Due  Topic Date Due   COVID-19 Vaccine (3 - 2023-24 season) 04/02/2022    Past Medical History:  Diagnosis Date   Allergy    Anxiety and depression 07/14/2022   Panic attacks 04/01/2022    Past Surgical History:  Procedure Laterality Date   TONSILLECTOMY  2006    Family History  Problem Relation Age of Onset   Cancer Cousin     Social History   Socioeconomic History   Marital status: Married    Spouse name: Not on file   Number of children: Not on file   Years of education: Not on file   Highest education level: Not on file  Occupational History   Not on file  Tobacco Use   Smoking status: Never   Smokeless tobacco: Never  Substance and Sexual Activity   Alcohol use: Yes    Alcohol/week: 0.0 standard drinks of alcohol    Comment: occasionally. 5 drinks a week.   Drug use: No   Sexual activity: Yes  Other Topics Concern   Not on file  Social History Narrative   Not on file   Social Determinants of Health   Financial Resource Strain: Not on file  Food Insecurity: Not on file  Transportation Needs: Not on file  Physical Activity: Not on file  Stress: Not on file  Social Connections: Not on file  Intimate Partner Violence: Not on file    Outpatient Medications Prior to Visit  Medication Sig Dispense Refill   ALPRAZolam (XANAX) 0.25 MG tablet Take 1 tablet (0.25 mg total) by mouth 2 (two) times daily as needed for anxiety. 20  tablet 0   augmented betamethasone dipropionate (DIPROLENE-AF) 0.05 % ointment Apply 1 Application topically 2 (two) times daily.     diclofenac Sodium (VOLTAREN) 1 % GEL Apply 2 g topically 4 (four) times daily. 100 g 1   escitalopram (LEXAPRO) 10 MG tablet Take 1 tablet (10 mg) by mouth daily. 30 tablet 2   No facility-administered medications prior to visit.    No Known Allergies  ROS     Objective:    Physical Exam Constitutional:      General: He is not in acute distress.    Appearance: He is not ill-appearing.  Cardiovascular:     Rate and Rhythm: Normal rate.  Pulmonary:     Effort: Pulmonary effort is normal.  Neurological:     General: No focal deficit present.     Mental Status: He is alert and oriented to person, place, and time.  Psychiatric:        Mood and Affect: Mood normal.        Behavior: Behavior normal.        Thought Content: Thought content normal.     BP 102/72 (BP Location: Left Arm, Patient Position: Sitting, Cuff Size: Large)   Pulse 72   Temp 97.8 F (36.6 C) (  Temporal)   Ht 5\' 8"  (1.727 m)   Wt 173 lb (78.5 kg)   SpO2 98%   BMI 26.30 kg/m  Wt Readings from Last 3 Encounters:  08/06/22 173 lb (78.5 kg)  07/14/22 173 lb (78.5 kg)  04/01/22 162 lb (73.5 kg)       Assessment & Plan:   Problem List Items Addressed This Visit       Other   Anxiety and depression - Primary   Panic attacks   Improving. Would like to continue Lexapro 10 mg. Continue therapy.  May take holiday from medication for sexual activity. Also discussed that his body should get used to the medication.  Follow up in 3 months or sooner if needed.    I am having [name] maintain his diclofenac Sodium, augmented betamethasone dipropionate, ALPRAZolam, and escitalopram.  No orders of the defined types were placed in this encounter.

## 2022-08-27 ENCOUNTER — Telehealth: Payer: 59 | Admitting: Physician Assistant

## 2022-08-27 ENCOUNTER — Other Ambulatory Visit: Payer: Self-pay | Admitting: Family Medicine

## 2022-08-27 DIAGNOSIS — J019 Acute sinusitis, unspecified: Secondary | ICD-10-CM | POA: Diagnosis not present

## 2022-08-27 DIAGNOSIS — B9689 Other specified bacterial agents as the cause of diseases classified elsewhere: Secondary | ICD-10-CM | POA: Diagnosis not present

## 2022-08-27 DIAGNOSIS — F410 Panic disorder [episodic paroxysmal anxiety] without agoraphobia: Secondary | ICD-10-CM

## 2022-08-27 DIAGNOSIS — F32A Depression, unspecified: Secondary | ICD-10-CM

## 2022-08-27 MED ORDER — AMOXICILLIN-POT CLAVULANATE 875-125 MG PO TABS: 1.0000 | ORAL_TABLET | Freq: Two times a day (BID) | ORAL | 0 refills | Status: DC

## 2022-08-27 NOTE — Progress Notes (Signed)
Virtual Visit Consent   Erik Mayer, you are scheduled for a virtual visit with a [facility] provider today. Just as with appointments in the office, your consent must be obtained to participate. Your consent will be active for this visit and any virtual visit you may have with one of our providers in the next 365 days. If you have a MyChart account, a copy of this consent can be sent to you electronically.  As this is a virtual visit, video technology does not allow for your provider to perform a traditional examination. This may limit your provider's ability to fully assess your condition. If your provider identifies any concerns that need to be evaluated in person or the need to arrange testing (such as labs, EKG, etc.), we will make arrangements to do so. Although advances in technology are sophisticated, we cannot ensure that it will always work on either your end or our end. If the connection with a video visit is poor, the visit may have to be switched to a telephone visit. With either a video or telephone visit, we are not always able to ensure that we have a secure connection.  By engaging in this virtual visit, you consent to the provision of healthcare and authorize for your insurance to be billed (if applicable) for the services provided during this visit. Depending on your insurance coverage, you may receive a charge related to this service.  I need to obtain your verbal consent now. Are you willing to proceed with your visit today? Erik Mayer has provided verbal consent on 08/27/2022 for a virtual visit (video or telephone). Mar Daring, PA-C  Date: 08/27/2022 9:20 AM  Virtual Visit via Video Note   I, Mar Daring, connected with  Erik Mayer  ([id number], [birth year]) on 08/27/22 at  9:15 AM EST by a video-enabled telemedicine application and verified that I am speaking with the correct person using two identifiers.  Location: Patient: Virtual  Visit Location Patient: Home Provider: Virtual Visit Location Provider: Home Office   I discussed the limitations of evaluation and management by telemedicine and the availability of in person appointments. The patient expressed understanding and agreed to proceed.    History of Present Illness: Erik Mayer is a 42 y.o. who identifies as a male who was assigned male at birth, and is being seen today for possible sinus infection.  HPI: Sinusitis This is a new problem. The current episode started 1 to 4 weeks ago. The problem has been gradually worsening since onset. Maximum temperature: possible low grade this morning. Associated symptoms include chills, congestion, coughing (mild), ear pain (left), headaches, a hoarse voice, sinus pressure and a sore throat. Treatments tried: mucinex, sudafed, vapocool, cough drops. The treatment provided no relief.     Problems:  Patient Active Problem List   Diagnosis Date Noted   Anxiety and depression 07/14/2022   Panic attacks 04/01/2022   Stress 04/01/2022   Bilateral shoulder pain 08/08/2019   Closed fracture of tuft of distal phalanx of finger 07/11/2019   Tendonitis, Achilles, right 04/23/2015    Allergies: No Known Allergies Medications:  Current Outpatient Medications:    ALPRAZolam (XANAX) 0.25 MG tablet, Take 1 tablet (0.25 mg total) by mouth 2 (two) times daily as needed for anxiety., Disp: 20 tablet, Rfl: 0   amoxicillin-clavulanate (AUGMENTIN) 875-125 MG tablet, Take 1 tablet by mouth 2 (two) times daily., Disp: 14 tablet, Rfl: 0   augmented betamethasone dipropionate (DIPROLENE-AF) 0.05 % ointment, Apply 1 Application topically  2 (two) times daily., Disp: , Rfl:    diclofenac Sodium (VOLTAREN) 1 % GEL, Apply 2 g topically 4 (four) times daily., Disp: 100 g, Rfl: 1   escitalopram (LEXAPRO) 10 MG tablet, Take 1 tablet (10 mg) by mouth daily., Disp: 30 tablet, Rfl: 2  Observations/Objective: Patient is well-developed,  well-nourished in no acute distress.  Resting comfortably at home.  Head is normocephalic, atraumatic.  No labored breathing.  Speech is clear and coherent with logical content.  Patient is alert and oriented at baseline.    Assessment and Plan: 1. Acute bacterial sinusitis - amoxicillin-clavulanate (AUGMENTIN) 875-125 MG tablet; Take 1 tablet by mouth 2 (two) times daily.  Dispense: 14 tablet; Refill: 0  - Worsening symptoms that have not responded to OTC medications.  - Will give Augmentin - Continue allergy medications.  - Steam and humidifier can help - Stay well hydrated and get plenty of rest.  - Seek in person evaluation if no symptom improvement or if symptoms worsen   Follow Up Instructions: I discussed the assessment and treatment plan with the patient. The patient was provided an opportunity to ask questions and all were answered. The patient agreed with the plan and demonstrated an understanding of the instructions.  A copy of instructions were sent to the patient via MyChart unless otherwise noted below.    The patient was advised to call back or seek an in-person evaluation if the symptoms worsen or if the condition fails to improve as anticipated.  Time:  I spent 10 minutes with the patient via telehealth technology discussing the above problems/concerns.    Mar Daring, PA-C

## 2022-08-27 NOTE — Patient Instructions (Signed)
[name], thank you for joining Mar Daring, PA-C for today's virtual visit.  While this provider is not your primary care provider (PCP), if your PCP is located in our provider database this encounter information will be shared with them immediately following your visit.   [facility] account gives you access to today's visit and all your visits, tests, and labs performed at [facility] " click here if you don't have a [facility] account or go to [email or web address]  Consent: (Patient) Erik Mayer provided verbal consent for this virtual visit at the beginning of the encounter.  Current Medications:  Current Outpatient Medications:    ALPRAZolam (XANAX) 0.25 MG tablet, Take 1 tablet (0.25 mg total) by mouth 2 (two) times daily as needed for anxiety., Disp: 20 tablet, Rfl: 0   amoxicillin-clavulanate (AUGMENTIN) 875-125 MG tablet, Take 1 tablet by mouth 2 (two) times daily., Disp: 14 tablet, Rfl: 0   augmented betamethasone dipropionate (DIPROLENE-AF) 0.05 % ointment, Apply 1 Application topically 2 (two) times daily., Disp: , Rfl:    diclofenac Sodium (VOLTAREN) 1 % GEL, Apply 2 g topically 4 (four) times daily., Disp: 100 g, Rfl: 1   escitalopram (LEXAPRO) 10 MG tablet, Take 1 tablet (10 mg) by mouth daily., Disp: 30 tablet, Rfl: 2   Medications ordered in this encounter:  Meds ordered this encounter  Medications   amoxicillin-clavulanate (AUGMENTIN) 875-125 MG tablet    Sig: Take 1 tablet by mouth 2 (two) times daily.    Dispense:  14 tablet    Refill:  0    Order Specific Question:   Supervising Provider    Answer:   Chase Picket [id number]     *If you need refills on other medications prior to your next appointment, please contact your pharmacy*  Follow-Up: Call back or seek an in-person evaluation if the symptoms worsen or if the condition fails to improve as anticipated.  [place] [phone]  Other Instructions  Sinus Infection, Adult A sinus infection, also called sinusitis, is inflammation of your sinuses. Sinuses are hollow spaces in the bones around your face. Your sinuses are located: Around your eyes. In the middle of your forehead. Behind your nose. In your cheekbones. Mucus normally drains out of your sinuses. When your nasal tissues become inflamed or swollen, mucus can become trapped or blocked. This allows bacteria, viruses, and fungi to grow, which leads to infection. Most infections of the sinuses are caused by a virus. A sinus infection can develop quickly. It can last for up to 4 weeks (acute) or for more than 12 weeks (chronic). A sinus infection often develops after a cold. What are the causes? This condition is caused by anything that creates swelling in the sinuses or stops mucus from draining. This includes: Allergies. Asthma. Infection from bacteria or viruses. Deformities or blockages in your nose or sinuses. Abnormal growths in the nose (nasal polyps). Pollutants, such as chemicals or irritants in the air. Infection from fungi. This is rare. What increases the risk? You are more likely to develop this condition if you: Have a weak body defense system (immune system). Do a lot of swimming or diving. Overuse nasal sprays. Smoke. What are the signs or symptoms? The main symptoms of this condition are pain and a feeling of pressure around the affected sinuses. Other symptoms include: Stuffy nose or congestion that makes it difficult to breathe through your nose. Thick yellow or greenish drainage from your nose.  Tenderness, swelling, and warmth over the affected sinuses. A cough that may get worse at night. Decreased sense of smell and taste. Extra mucus that collects in the throat or the back of the nose (postnasal drip) causing a sore throat or bad breath. Tiredness (fatigue). Fever. How is this diagnosed? This condition is diagnosed  based on: Your symptoms. Your medical history. A physical exam. Tests to find out if your condition is acute or chronic. This may include: Checking your nose for nasal polyps. Viewing your sinuses using a device that has a light (endoscope). Testing for allergies or bacteria. Imaging tests, such as an MRI or CT scan. In rare cases, a bone biopsy may be done to rule out more serious types of fungal sinus disease. How is this treated? Treatment for a sinus infection depends on the cause and whether your condition is chronic or acute. If caused by a virus, your symptoms should go away on their own within 10 days. You may be given medicines to relieve symptoms. They include: Medicines that shrink swollen nasal passages (decongestants). A spray that eases inflammation of the nostrils (topical intranasal corticosteroids). Rinses that help get rid of thick mucus in your nose (nasal saline washes). Medicines that treat allergies (antihistamines). Over-the-counter pain relievers. If caused by bacteria, your health care provider may recommend waiting to see if your symptoms improve. Most bacterial infections will get better without antibiotic medicine. You may be given antibiotics if you have: A severe infection. A weak immune system. If caused by narrow nasal passages or nasal polyps, surgery may be needed. Follow these instructions at home: Medicines Take, use, or apply over-the-counter and prescription medicines only as told by your health care provider. These may include nasal sprays. If you were prescribed an antibiotic medicine, take it as told by your health care provider. Do not stop taking the antibiotic even if you start to feel better. Hydrate and humidify  Drink enough fluid to keep your urine pale yellow. Staying hydrated will help to thin your mucus. Use a cool mist humidifier to keep the humidity level in your home above 50%. Inhale steam for 10-15 minutes, 3-4 times a day, or as  told by your health care provider. You can do this in the bathroom while a hot shower is running. Limit your exposure to cool or dry air. Rest Rest as much as possible. Sleep with your head raised (elevated). Make sure you get enough sleep each night. General instructions  Apply a warm, moist washcloth to your face 3-4 times a day or as told by your health care provider. This will help with discomfort. Use nasal saline washes as often as told by your health care provider. Wash your hands often with soap and water to reduce your exposure to germs. If soap and water are not available, use hand sanitizer. Do not smoke. Avoid being around people who are smoking (secondhand smoke). Keep all follow-up visits. This is important. Contact a health care provider if: You have a fever. Your symptoms get worse. Your symptoms do not improve within 10 days. Get help right away if: You have a severe headache. You have persistent vomiting. You have severe pain or swelling around your face or eyes. You have vision problems. You develop confusion. Your neck is stiff. You have trouble breathing. These symptoms may be an emergency. Get help right away. Call 911. Do not wait to see if the symptoms will go away. Do not drive yourself to the hospital. Summary A  sinus infection is soreness and inflammation of your sinuses. Sinuses are hollow spaces in the bones around your face. This condition is caused by nasal tissues that become inflamed or swollen. The swelling traps or blocks the flow of mucus. This allows bacteria, viruses, and fungi to grow, which leads to infection. If you were prescribed an antibiotic medicine, take it as told by your health care provider. Do not stop taking the antibiotic even if you start to feel better. Keep all follow-up visits. This is important. This information is not intended to replace advice given to you by your health care provider. Make sure you discuss any questions you  have with your health care provider. Document Revised: 06/23/2021 Document Reviewed: 06/23/2021 Elsevier Patient Education  2023 Elsevier Inc.    If you have been instructed to have an in-person evaluation today at a local Urgent Care facility, please use the link below. It will take you to a list of all of our available [facility], including address, phone number and hours of operation. Please do not delay care.  [facility]  If you or a family member do not have a primary care provider, use the link below to schedule a visit and establish care. When you choose a [facility] primary care physician or advanced practice provider, you gain a long-term partner in health. Find a Primary Care Provider  Learn more about [facility]'s in-office and virtual care options: [facility] - Get Care Now

## 2022-08-31 ENCOUNTER — Ambulatory Visit: Payer: 59 | Admitting: Family Medicine

## 2022-09-08 ENCOUNTER — Other Ambulatory Visit: Payer: Self-pay | Admitting: Family Medicine

## 2022-09-08 DIAGNOSIS — F410 Panic disorder [episodic paroxysmal anxiety] without agoraphobia: Secondary | ICD-10-CM

## 2022-09-08 DIAGNOSIS — F32A Depression, unspecified: Secondary | ICD-10-CM

## 2022-09-27 ENCOUNTER — Other Ambulatory Visit: Payer: Self-pay | Admitting: Family Medicine

## 2022-09-27 DIAGNOSIS — F419 Anxiety disorder, unspecified: Secondary | ICD-10-CM

## 2022-09-27 DIAGNOSIS — F410 Panic disorder [episodic paroxysmal anxiety] without agoraphobia: Secondary | ICD-10-CM

## 2022-11-11 ENCOUNTER — Encounter: Payer: Self-pay | Admitting: Family Medicine

## 2022-11-11 ENCOUNTER — Ambulatory Visit (INDEPENDENT_AMBULATORY_CARE_PROVIDER_SITE_OTHER): Payer: 59 | Admitting: Family Medicine

## 2022-11-11 DIAGNOSIS — F32A Depression, unspecified: Secondary | ICD-10-CM | POA: Diagnosis not present

## 2022-11-11 DIAGNOSIS — F419 Anxiety disorder, unspecified: Secondary | ICD-10-CM | POA: Diagnosis not present

## 2022-11-11 MED ORDER — ESCITALOPRAM OXALATE 10 MG PO TABS: ORAL_TABLET | ORAL | 1 refills | Status: DC

## 2022-11-11 NOTE — Progress Notes (Signed)
Subjective:     Patient ID: Erik Mayer, male    DOB: 21-Feb-1981, 42 y.o.   MRN: [id number]  Chief Complaint  Patient presents with   Anxiety   Depression    Anxiety Patient reports no chest pain, dizziness, nausea, palpitations or shortness of breath.    Depression        Past medical history includes anxiety.    Patient is in today for follow up on anxiety and panic attacks.  Taking Lexapro 10 mg daily. Doing well. No longer seeing a therapist.  No panic attacks since our last visit.  He is not needing alprazolam.  No side effects.   No new concerns.    Health Maintenance Due  Topic Date Due   COVID-19 Vaccine (3 - 2023-24 season) 04/02/2022    Past Medical History:  Diagnosis Date   Allergy    Anxiety and depression 07/14/2022   Panic attacks 04/01/2022    Past Surgical History:  Procedure Laterality Date   TONSILLECTOMY  2006    Family History  Problem Relation Age of Onset   Cancer Cousin     Social History   Socioeconomic History   Marital status: Married    Spouse name: Not on file   Number of children: Not on file   Years of education: Not on file   Highest education level: Bachelor's degree (e.g., BA, AB, BS)  Occupational History   Not on file  Tobacco Use   Smoking status: Never   Smokeless tobacco: Never  Substance and Sexual Activity   Alcohol use: Yes    Alcohol/week: 0.0 standard drinks of alcohol    Comment: occasionally. 5 drinks a week.   Drug use: No   Sexual activity: Yes  Other Topics Concern   Not on file  Social History Narrative   Not on file   Social Determinants of Health   Financial Resource Strain: Low Risk  (11/07/2022)   Overall Financial Resource Strain (CARDIA)    Difficulty of Paying Living Expenses: Not hard at all  Food Insecurity: No Food Insecurity (11/07/2022)   Hunger Vital Sign    Worried About Running Out of Food in the Last Year: Never true    Ran Out of Food in the Last Year: Never true   Transportation Needs: No Transportation Needs (11/07/2022)   PRAPARE - Administrator, Civil Service (Medical): No    Lack of Transportation (Non-Medical): No  Physical Activity: Sufficiently Active (11/07/2022)   Exercise Vital Sign    Days of Exercise per Week: 7 days    Minutes of Exercise per Session: 60 min  Stress: Stress Concern Present (11/07/2022)   [employer] of Occupational Health - Occupational Stress Questionnaire    Feeling of Stress : Rather much  Social Connections: Moderately Isolated (11/07/2022)   Social Connection and Isolation Panel [NHANES]    Frequency of Communication with Friends and Family: More than three times a week    Frequency of Social Gatherings with Friends and Family: Twice a week    Attends Religious Services: Never    Database administrator or Organizations: No    Attends Engineer, structural: Not on file    Marital Status: Married  Catering manager Violence: Not on file    Outpatient Medications Prior to Visit  Medication Sig Dispense Refill   ALPRAZolam (XANAX) 0.25 MG tablet Take 1 tablet (0.25 mg total) by mouth 2 (two) times daily as needed for anxiety. 20  tablet 0   augmented betamethasone dipropionate (DIPROLENE-AF) 0.05 % ointment Apply 1 Application topically 2 (two) times daily.     diclofenac Sodium (VOLTAREN) 1 % GEL Apply 2 g topically 4 (four) times daily. 100 g 1   escitalopram (LEXAPRO) 10 MG tablet TAKE 1 TABLET (10 MG) BY MOUTH DAILY. 90 tablet 1   amoxicillin-clavulanate (AUGMENTIN) 875-125 MG tablet Take 1 tablet by mouth 2 (two) times daily. 14 tablet 0   No facility-administered medications prior to visit.    No Known Allergies  Review of Systems  Constitutional:  Negative for chills and fever.  Respiratory:  Negative for shortness of breath.   Cardiovascular:  Negative for chest pain, palpitations and leg swelling.  Gastrointestinal:  Negative for abdominal pain, constipation, diarrhea, nausea and  vomiting.  Genitourinary:  Negative for dysuria, frequency and urgency.  Neurological:  Negative for dizziness.  Psychiatric/Behavioral:  Negative for depression.        Objective:    Physical Exam Constitutional:      General: He is not in acute distress.    Appearance: He is not ill-appearing.  Eyes:     Extraocular Movements: Extraocular movements intact.     Conjunctiva/sclera: Conjunctivae normal.  Cardiovascular:     Rate and Rhythm: Normal rate.  Pulmonary:     Effort: Pulmonary effort is normal.  Musculoskeletal:     Cervical back: Normal range of motion and neck supple.  Skin:    General: Skin is warm and dry.  Neurological:     General: No focal deficit present.     Mental Status: He is alert and oriented to person, place, and time.  Psychiatric:        Mood and Affect: Mood normal.        Behavior: Behavior normal.        Thought Content: Thought content normal.     BP 108/62 (BP Location: Left Arm, Patient Position: Sitting, Cuff Size: Large)   Pulse 77   Temp 97.6 F (36.4 C) (Temporal)   Ht 5\' 8"  (1.727 m)   Wt 175 lb (79.4 kg)   SpO2 99%   BMI 26.61 kg/m  Wt Readings from Last 3 Encounters:  11/11/22 175 lb (79.4 kg)  08/06/22 173 lb (78.5 kg)  07/14/22 173 lb (78.5 kg)       Assessment & Plan:   Problem List Items Addressed This Visit       Other   Anxiety and depression   Relevant Medications   escitalopram (LEXAPRO) 10 MG tablet   Reports being in his usual state of health.  No more panic attacks. Continue Lexapro 10 mg daily.  No side effects.  He is doing well.  Refilled medication for 6 months.  Reviewed previous labs with patient.  Discussed scheduling a fasting CPE at his convenience.  I have discontinued Andyn Martos's amoxicillin-clavulanate. I am also having him maintain his diclofenac Sodium, augmented betamethasone dipropionate, ALPRAZolam, and escitalopram.  Meds ordered this encounter  Medications   escitalopram  (LEXAPRO) 10 MG tablet    Sig: TAKE 1 TABLET (10 MG) BY MOUTH DAILY.    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

## 2022-11-15 ENCOUNTER — Encounter: Payer: Self-pay | Admitting: *Deleted

## 2023-03-10 ENCOUNTER — Other Ambulatory Visit: Payer: Self-pay | Admitting: Family Medicine

## 2023-03-10 DIAGNOSIS — F410 Panic disorder [episodic paroxysmal anxiety] without agoraphobia: Secondary | ICD-10-CM

## 2023-03-10 DIAGNOSIS — F439 Reaction to severe stress, unspecified: Secondary | ICD-10-CM

## 2023-03-11 MED ORDER — ALPRAZOLAM 0.25 MG PO TABS: 0.2500 mg | ORAL_TABLET | Freq: Two times a day (BID) | ORAL | 0 refills | Status: DC | PRN

## 2023-03-17 ENCOUNTER — Ambulatory Visit: Payer: 59 | Admitting: Family Medicine

## 2023-03-17 ENCOUNTER — Encounter: Payer: Self-pay | Admitting: Family Medicine

## 2023-03-17 VITALS — BP 124/76 | HR 105 | Temp 97.6°F | Ht 68.0 in | Wt 165.0 lb

## 2023-03-17 DIAGNOSIS — Z0000 Encounter for general adult medical examination without abnormal findings: Secondary | ICD-10-CM | POA: Diagnosis not present

## 2023-03-17 DIAGNOSIS — F32A Depression, unspecified: Secondary | ICD-10-CM

## 2023-03-17 DIAGNOSIS — F410 Panic disorder [episodic paroxysmal anxiety] without agoraphobia: Secondary | ICD-10-CM | POA: Diagnosis not present

## 2023-03-17 DIAGNOSIS — Z13220 Encounter for screening for lipoid disorders: Secondary | ICD-10-CM

## 2023-03-17 DIAGNOSIS — F419 Anxiety disorder, unspecified: Secondary | ICD-10-CM

## 2023-03-17 DIAGNOSIS — F439 Reaction to severe stress, unspecified: Secondary | ICD-10-CM

## 2023-03-17 DIAGNOSIS — Z0001 Encounter for general adult medical examination with abnormal findings: Secondary | ICD-10-CM | POA: Insufficient documentation

## 2023-03-17 HISTORY — DX: Encounter for general adult medical examination with abnormal findings: Z00.01

## 2023-03-17 LAB — COMPREHENSIVE METABOLIC PANEL
ALT: 27 U/L (ref 0–53)
AST: 26 U/L (ref 0–37)
Albumin: 4.8 g/dL (ref 3.5–5.2)
Alkaline Phosphatase: 26 U/L — ABNORMAL LOW (ref 39–117)
BUN: 21 mg/dL (ref 6–23)
CO2: 27 meq/L (ref 19–32)
Calcium: 9.7 mg/dL (ref 8.4–10.5)
Chloride: 103 meq/L (ref 96–112)
Creatinine, Ser: 1.09 mg/dL (ref 0.40–1.50)
GFR: 83.44 mL/min (ref >60.00)
Glucose, Bld: 95 mg/dL (ref 70–99)
Potassium: 3.8 meq/L (ref 3.5–5.1)
Sodium: 137 mEq/L (ref 135–145)
Total Bilirubin: 0.7 mg/dL (ref 0.2–1.2)
Total Protein: 7.2 g/dL (ref 6.0–8.3)

## 2023-03-17 LAB — CBC WITH DIFFERENTIAL/PLATELET
Basophils Absolute: 0 10*3/uL (ref 0.0–0.1)
Basophils Relative: 0.6 % (ref 0.0–3.0)
Eosinophils Absolute: 0.1 10*3/uL (ref 0.0–0.7)
Eosinophils Relative: 2.2 % (ref 0.0–5.0)
HCT: 50 % (ref 39.0–52.0)
Hemoglobin: 16.7 g/dL (ref 13.0–17.0)
Lymphocytes Relative: 36.7 % (ref 12.0–46.0)
Lymphs Abs: 1.6 10*3/uL (ref 0.7–4.0)
MCHC: 33.4 g/dL (ref 30.0–36.0)
MCV: 86.6 fl (ref 78.0–100.0)
Monocytes Absolute: 0.4 10*3/uL (ref 0.1–1.0)
Monocytes Relative: 10.2 % (ref 3.0–12.0)
Neutro Abs: 2.2 10*3/uL (ref 1.4–7.7)
Neutrophils Relative %: 50.3 % (ref 43.0–77.0)
Platelets: 168 10*3/uL (ref 150.0–400.0)
RBC: 5.77 Mil/uL (ref 4.22–5.81)
RDW: 12.4 % (ref 11.5–15.5)
WBC: 4.4 10*3/uL (ref 4.0–10.5)

## 2023-03-17 LAB — LIPID PANEL
Cholesterol: 179 mg/dL (ref 0–200)
HDL: 70.6 mg/dL (ref >39.00)
LDL Cholesterol: 99 mg/dL (ref 0–99)
NonHDL: 108.89
Total CHOL/HDL Ratio: 3
Triglycerides: 49 mg/dL (ref 0.0–149.0)
VLDL: 9.8 mg/dL (ref 0.0–40.0)

## 2023-03-17 NOTE — Assessment & Plan Note (Signed)
Plans to schedule with psychiatrist and therapist. Continue Lexapro and prn alprazolam.

## 2023-03-17 NOTE — Assessment & Plan Note (Signed)
Preventive health care reviewed.  Counseling on healthy lifestyle including diet and exercise.  Recommend regular dental and eye exams.  Immunizations reviewed.  Discussed safety.  

## 2023-03-17 NOTE — Progress Notes (Signed)
Complete physical exam  Patient: Erik Mayer   DOB: 04-19-1981   42 y.o. Male  MRN: [id number]  Subjective:    Chief Complaint  Patient presents with   Annual Exam    Thinks he had nervous breakdown on Monday   He is here for a complete physical exam.   Recent ?panic attack. States he has a new boss.  Feels like he has more support now.   Currently taking a leave from work.   States he hit rock bottom 3 days ago and his trend line is up.   States he is feeling very grateful to his wife, friends and support people.   Does not currently have a therapist.  Taking Lexapro 10 mg daily and alprazolam prn.        03/17/2023    8:45 AM 11/11/2022    9:15 AM 04/01/2022    4:21 PM 10/29/2021   11:37 AM 10/27/2016    8:00 AM  Depression screen PHQ 2/9  Decreased Interest 3 0 0 0 0  Down, Depressed, Hopeless 3 0 1 0 0  PHQ - 2 Score 6 0 1 0 0  Altered sleeping 3  1    Tired, decreased energy 3  0    Change in appetite 3  0    Feeling bad or failure about yourself  3  1    Trouble concentrating 3  0    Moving slowly or fidgety/restless 3  1    Suicidal thoughts 0  0    PHQ-9 Score 24  4    Difficult doing work/chores Very difficult  Not difficult at all        Health Maintenance  Topic Date Due   Flu Shot  03/03/2023   COVID-19 Vaccine (3 - 2023-24 season) 04/02/2023*   Hepatitis C Screening  04/22/2023*   DTaP/Tdap/Td vaccine (2 - Td or Tdap) 10/28/2026   HIV Screening  Completed   HPV Vaccine  Aged Out  *Topic was postponed. The date shown is not the original due date.    Wears seatbelt always, uses sunscreen, smoke detectors in home and functioning, does not text while driving, feels safe in home environment.  Depression screening:    03/17/2023    8:45 AM 11/11/2022    9:15 AM 04/01/2022    4:21 PM  Depression screen PHQ 2/9  Decreased Interest 3 0 0  Down, Depressed, Hopeless 3 0 1  PHQ - 2 Score 6 0 1  Altered sleeping 3  1  Tired, decreased  energy 3  0  Change in appetite 3  0  Feeling bad or failure about yourself  3  1  Trouble concentrating 3  0  Moving slowly or fidgety/restless 3  1  Suicidal thoughts 0  0  PHQ-9 Score 24  4  Difficult doing work/chores Very difficult  Not difficult at all   Anxiety Screening:     No data to display          Vision:no issues  and Dental: No current dental problems and Receives regular dental care  Patient Active Problem List   Diagnosis Date Noted   Encounter for general adult medical examination with abnormal findings 03/17/2023   Anxiety and depression 07/14/2022   Panic attacks 04/01/2022   Stress 04/01/2022   Bilateral shoulder pain 08/08/2019   Closed fracture of tuft of distal phalanx of finger 07/11/2019   Tendonitis, Achilles, right 04/23/2015   Past Medical History:  Diagnosis Date  Allergy    Anxiety and depression 07/14/2022   Encounter for general adult medical examination with abnormal findings 03/17/2023   Panic attacks 04/01/2022   Past Surgical History:  Procedure Laterality Date   TONSILLECTOMY  08/02/2004   Social History   Tobacco Use   Smoking status: Never   Smokeless tobacco: Never  Substance Use Topics   Alcohol use: Yes    Alcohol/week: 6.0 standard drinks of alcohol    Types: 3 Cans of beer, 3 Shots of liquor per week    Comment: occasionally. 5 drinks a week.   Drug use: No      Patient Care Team: Avanell Shackleton, NP-C as PCP - General (Family Medicine)   Outpatient Medications Prior to Visit  Medication Sig   ALPRAZolam (XANAX) 0.25 MG tablet Take 1 tablet (0.25 mg total) by mouth 2 (two) times daily as needed for anxiety.   augmented betamethasone dipropionate (DIPROLENE-AF) 0.05 % ointment Apply 1 Application topically 2 (two) times daily.   diclofenac Sodium (VOLTAREN) 1 % GEL Apply 2 g topically 4 (four) times daily.   escitalopram (LEXAPRO) 10 MG tablet TAKE 1 TABLET (10 MG) BY MOUTH DAILY.   No facility-administered  medications prior to visit.    Review of Systems  Constitutional:  Negative for chills and fever.  HENT:  Negative for congestion, ear pain, sinus pain and sore throat.   Eyes:  Negative for blurred vision, double vision and pain.  Respiratory:  Negative for cough, shortness of breath and wheezing.   Cardiovascular:  Negative for chest pain, palpitations and leg swelling.  Gastrointestinal:  Negative for abdominal pain, constipation, diarrhea, nausea and vomiting.  Genitourinary:  Negative for dysuria, frequency and urgency.  Musculoskeletal:  Negative for back pain, joint pain and myalgias.  Skin:  Negative for rash.  Neurological:  Negative for dizziness, tingling, focal weakness and headaches.  Endo/Heme/Allergies:  Does not bruise/bleed easily.  Psychiatric/Behavioral:  Positive for depression. Negative for memory loss, substance abuse and suicidal ideas. The patient is nervous/anxious.        Objective:    BP 124/76 (BP Location: Left Arm, Patient Position: Sitting, Cuff Size: Large)   Pulse (!) 105   Temp 97.6 F (36.4 C) (Temporal)   Ht 5\' 8"  (1.727 m)   Wt 165 lb (74.8 kg)   SpO2 98%   BMI 25.09 kg/m  BP Readings from Last 3 Encounters:  03/17/23 124/76  11/11/22 108/62  08/06/22 102/72   Wt Readings from Last 3 Encounters:  03/17/23 165 lb (74.8 kg)  11/11/22 175 lb (79.4 kg)  08/06/22 173 lb (78.5 kg)    Physical Exam Constitutional:      General: He is not in acute distress.    Appearance: He is not ill-appearing.  HENT:     Right Ear: Tympanic membrane, ear canal and external ear normal.     Left Ear: Tympanic membrane, ear canal and external ear normal.     Nose: Nose normal.     Mouth/Throat:     Mouth: Mucous membranes are moist.     Pharynx: Oropharynx is clear.  Eyes:     Extraocular Movements: Extraocular movements intact.     Conjunctiva/sclera: Conjunctivae normal.     Pupils: Pupils are equal, round, and reactive to light.  Neck:      Thyroid: No thyroid mass, thyromegaly or thyroid tenderness.  Cardiovascular:     Rate and Rhythm: Normal rate and regular rhythm.     Pulses: Normal  pulses.     Heart sounds: Normal heart sounds.  Pulmonary:     Effort: Pulmonary effort is normal.     Breath sounds: Normal breath sounds.  Abdominal:     General: Bowel sounds are normal. There is no distension.     Palpations: Abdomen is soft.     Tenderness: There is no abdominal tenderness. There is no right CVA tenderness, left CVA tenderness, guarding or rebound.  Musculoskeletal:        General: Normal range of motion.     Cervical back: Normal range of motion and neck supple. No tenderness.     Right lower leg: No edema.     Left lower leg: No edema.  Lymphadenopathy:     Cervical: No cervical adenopathy.  Skin:    General: Skin is warm and dry.     Findings: No lesion or rash.  Neurological:     General: No focal deficit present.     Mental Status: He is alert and oriented to person, place, and time.     Cranial Nerves: No cranial nerve deficit.     Sensory: No sensory deficit.     Motor: No weakness.  Psychiatric:        Mood and Affect: Mood normal.        Speech: Speech normal.        Behavior: Behavior normal.        Thought Content: Thought content normal. Thought content does not include suicidal ideation.      No results found for any visits on 03/17/23.    Assessment & Plan:    Routine Health Maintenance and Physical Exam  Problem List Items Addressed This Visit       Other   Anxiety and depression    Plans to schedule with psychiatrist and therapist. Continue Lexapro and prn alprazolam.       Relevant Orders   TSH   Encounter for general adult medical examination with abnormal findings - Primary    Preventive health care reviewed.  Counseling on healthy lifestyle including diet and exercise.  Recommend regular dental and eye exams.  Immunizations reviewed.  Discussed safety.       Relevant  Orders   CBC with Differential/Platelet   Comprehensive metabolic panel   Lipid panel   TSH   Panic attacks    Plans to schedule with psychiatrist and therapist. Continue Lexapro and prn alprazolam.       Stress   Relevant Orders   TSH    Visit time 35 minutes in face to face communication with patient and coordination of care, additional 10 minutes spent in record review, coordination or care, ordering tests, communicating/referring to other healthcare professionals, documenting in medical records all on the same day of the visit for total time 45 minutes spent on the visit.   Return in about 4 weeks (around 04/14/2023).     Hetty Blend, NP-C

## 2023-03-17 NOTE — Patient Instructions (Signed)
 [email or web address]   [email or web address]   [facility] Multiple locations [phone]     [facility] [address] [place], [place] [address]  Phone: [phone]   [facility]  [address], [place], [place] [address]  Phone: [phone]

## 2023-03-18 LAB — TSH: TSH: 0.68 u[IU]/mL (ref 0.35–5.50)

## 2023-06-16 ENCOUNTER — Encounter: Payer: Self-pay | Admitting: Family Medicine

## 2023-06-16 ENCOUNTER — Ambulatory Visit: Payer: 59 | Admitting: Family Medicine

## 2023-06-16 VITALS — BP 118/72 | HR 87 | Temp 99.1°F | Ht 68.0 in | Wt 172.4 lb

## 2023-06-16 DIAGNOSIS — F439 Reaction to severe stress, unspecified: Secondary | ICD-10-CM

## 2023-06-16 DIAGNOSIS — F419 Anxiety disorder, unspecified: Secondary | ICD-10-CM | POA: Diagnosis not present

## 2023-06-16 DIAGNOSIS — F410 Panic disorder [episodic paroxysmal anxiety] without agoraphobia: Secondary | ICD-10-CM

## 2023-06-16 DIAGNOSIS — F32A Depression, unspecified: Secondary | ICD-10-CM

## 2023-06-16 DIAGNOSIS — G4700 Insomnia, unspecified: Secondary | ICD-10-CM | POA: Diagnosis not present

## 2023-06-16 MED ORDER — ALPRAZOLAM 0.25 MG PO TABS: 0.2500 mg | ORAL_TABLET | Freq: Two times a day (BID) | ORAL | 0 refills | Status: DC | PRN

## 2023-06-16 MED ORDER — TRAZODONE HCL 50 MG PO TABS: 50.0000 mg | ORAL_TABLET | Freq: Every day | ORAL | 0 refills | Status: DC

## 2023-06-16 NOTE — Progress Notes (Signed)
Subjective:     Patient ID: Erik Mayer, male    DOB: 11-Aug-1980, 42 y.o.   MRN: [id number]  Chief Complaint  Patient presents with   mental health    Patient states he is currently going through a divorce and hasn't gotten more than 4 hours of sleep. Med refill for xanax    HPI   History of Present Illness          He is here for follow-up on anxiety and depression.  Currently going through a divorce which is causing him more anxiety and panic attacks.  He is taking Lexapro 10 mg.  Using alprazolam more often.  He is seeing a therapist.  Having issues with sleep.    Health Maintenance Due  Topic Date Due   Hepatitis C Screening  Never done    Past Medical History:  Diagnosis Date   Allergy    Anxiety and depression 07/14/2022   Encounter for general adult medical examination with abnormal findings 03/17/2023   Panic attacks 04/01/2022    Past Surgical History:  Procedure Laterality Date   TONSILLECTOMY  08/02/2004    Family History  Problem Relation Age of Onset   Cancer Cousin     Social History   Socioeconomic History   Marital status: Married    Spouse name: Not on file   Number of children: Not on file   Years of education: Not on file   Highest education level: Bachelor's degree (e.g., BA, AB, BS)  Occupational History   Not on file  Tobacco Use   Smoking status: Never   Smokeless tobacco: Never  Substance and Sexual Activity   Alcohol use: Yes    Alcohol/week: 6.0 standard drinks of alcohol    Types: 3 Cans of beer, 3 Shots of liquor per week    Comment: occasionally. 5 drinks a week.   Drug use: No   Sexual activity: Yes  Other Topics Concern   Not on file  Social History Narrative   Not on file   Social Determinants of Health   Financial Resource Strain: Low Risk  (06/12/2023)   Overall Financial Resource Strain (CARDIA)    Difficulty of Paying Living Expenses: Not hard at all  Food Insecurity: No Food Insecurity  (06/12/2023)   Hunger Vital Sign    Worried About Running Out of Food in the Last Year: Never true    Ran Out of Food in the Last Year: Never true  Transportation Needs: No Transportation Needs (06/12/2023)   PRAPARE - Administrator, Civil Service (Medical): No    Lack of Transportation (Non-Medical): No  Physical Activity: Sufficiently Active (06/12/2023)   Exercise Vital Sign    Days of Exercise per Week: 4 days    Minutes of Exercise per Session: 60 min  Stress: Stress Concern Present (06/12/2023)   [employer] of Occupational Health - Occupational Stress Questionnaire    Feeling of Stress : Very much  Social Connections: Socially Isolated (06/12/2023)   Social Connection and Isolation Panel [NHANES]    Frequency of Communication with Friends and Family: More than three times a week    Frequency of Social Gatherings with Friends and Family: Three times a week    Attends Religious Services: Never    Active Member of Clubs or Organizations: No    Attends Banker Meetings: Not on file    Marital Status: Separated  Intimate Partner Violence: Not on file    Outpatient Medications  Prior to Visit  Medication Sig Dispense Refill   augmented betamethasone dipropionate (DIPROLENE-AF) 0.05 % ointment Apply 1 Application topically 2 (two) times daily.     diclofenac Sodium (VOLTAREN) 1 % GEL Apply 2 g topically 4 (four) times daily. 100 g 1   escitalopram (LEXAPRO) 10 MG tablet TAKE 1 TABLET (10 MG) BY MOUTH DAILY. 90 tablet 1   ALPRAZolam (XANAX) 0.25 MG tablet Take 1 tablet (0.25 mg total) by mouth 2 (two) times daily as needed for anxiety. 20 tablet 0   No facility-administered medications prior to visit.    No Known Allergies  Review of Systems  Constitutional:  Negative for chills and fever.  Respiratory:  Negative for shortness of breath.   Cardiovascular:  Negative for chest pain, palpitations and leg swelling.  Gastrointestinal:  Negative for  abdominal pain, constipation, diarrhea, nausea and vomiting.  Genitourinary:  Negative for dysuria, frequency and urgency.  Neurological:  Negative for dizziness and focal weakness.  Psychiatric/Behavioral:  Positive for depression. Negative for substance abuse and suicidal ideas. The patient is nervous/anxious and has insomnia.        Objective:    Physical Exam Constitutional:      General: He is not in acute distress.    Appearance: He is not ill-appearing.  Eyes:     Extraocular Movements: Extraocular movements intact.     Conjunctiva/sclera: Conjunctivae normal.  Cardiovascular:     Rate and Rhythm: Normal rate.  Pulmonary:     Effort: Pulmonary effort is normal.  Musculoskeletal:     Cervical back: Normal range of motion and neck supple.  Skin:    General: Skin is warm and dry.  Neurological:     General: No focal deficit present.     Mental Status: He is alert and oriented to person, place, and time.  Psychiatric:        Mood and Affect: Mood normal.        Behavior: Behavior normal.        Thought Content: Thought content normal.      BP 118/72 (BP Location: Left Arm, Patient Position: Sitting, Cuff Size: Normal)   Pulse 87   Temp 99.1 F (37.3 C) (Oral)   Ht 5\' 8"  (1.727 m)   Wt 172 lb 6.4 oz (78.2 kg)   SpO2 96%   BMI 26.21 kg/m  Wt Readings from Last 3 Encounters:  06/16/23 172 lb 6.4 oz (78.2 kg)  03/17/23 165 lb (74.8 kg)  11/11/22 175 lb (79.4 kg)       Assessment & Plan:   Problem List Items Addressed This Visit       Other   Anxiety and depression - Primary   Relevant Medications   ALPRAZolam (XANAX) 0.25 MG tablet   traZODone (DESYREL) 50 MG tablet   Panic attacks   Relevant Medications   ALPRAZolam (XANAX) 0.25 MG tablet   traZODone (DESYREL) 50 MG tablet   Stress   Relevant Medications   ALPRAZolam (XANAX) 0.25 MG tablet   Other Visit Diagnoses     Insomnia, unspecified type       Relevant Medications   traZODone (DESYREL) 50  MG tablet      He is going through divorce and having to move out of his home.  Having issues with sleep and using alprazolam more often.  He will start trazodone.  Continue low-dose Lexapro.  Use alprazolam sparingly for panic attacks and not for sleep.  Continue with therapy. He will send me  a MyChart message and let me know how he is doing in 3 weeks.  He will follow-up in office in 3 months or sooner if needed.  I am having Ardyth Harps start on traZODone. I am also having him maintain his diclofenac Sodium, augmented betamethasone dipropionate, escitalopram, and ALPRAZolam.  Meds ordered this encounter  Medications   ALPRAZolam (XANAX) 0.25 MG tablet    Sig: Take 1 tablet (0.25 mg total) by mouth 2 (two) times daily as needed for anxiety.    Dispense:  20 tablet    Refill:  0    Order Specific Question:   Supervising Provider    Answer:   Hillard Danker A [[id number]]   traZODone (DESYREL) 50 MG tablet    Sig: Take 1 tablet (50 mg total) by mouth at bedtime.    Dispense:  90 tablet    Refill:  0    Order Specific Question:   Supervising Provider    Answer:   Hillard Danker A [[id number]]

## 2023-06-16 NOTE — Patient Instructions (Signed)
Start trazodone 1 hour before you plan to fall asleep.  Continue Lexapro.  Continue alprazolam only as needed.  Follow-up with me via MyChart in 3 weeks.

## 2023-07-16 ENCOUNTER — Encounter: Payer: Self-pay | Admitting: Family Medicine

## 2023-07-16 DIAGNOSIS — F32A Depression, unspecified: Secondary | ICD-10-CM

## 2023-07-18 MED ORDER — ESCITALOPRAM OXALATE 10 MG PO TABS: ORAL_TABLET | ORAL | 1 refills | Status: DC

## 2023-07-18 NOTE — Telephone Encounter (Signed)
fyi

## 2023-12-30 ENCOUNTER — Encounter: Payer: Self-pay | Admitting: Family Medicine

## 2024-01-10 ENCOUNTER — Telehealth: Admitting: Physician Assistant

## 2024-01-10 DIAGNOSIS — B372 Candidiasis of skin and nail: Secondary | ICD-10-CM

## 2024-01-10 MED ORDER — NYSTATIN 100000 UNIT/GM EX CREA
1.0000 | TOPICAL_CREAM | Freq: Two times a day (BID) | CUTANEOUS | 0 refills | Status: DC
Start: 2024-01-10 — End: 2024-03-09

## 2024-01-10 MED ORDER — FLUCONAZOLE 150 MG PO TABS: 150.0000 mg | ORAL_TABLET | ORAL | 0 refills | Status: DC

## 2024-01-10 NOTE — Progress Notes (Signed)
 I have spent 5 minutes in review of e-visit questionnaire, review and updating patient chart, medical decision making and response to patient.   Piedad Climes, PA-C

## 2024-01-10 NOTE — Progress Notes (Signed)

## 2024-03-06 ENCOUNTER — Other Ambulatory Visit: Payer: Self-pay | Admitting: Family Medicine

## 2024-03-06 ENCOUNTER — Encounter: Payer: Self-pay | Admitting: Family Medicine

## 2024-03-06 DIAGNOSIS — F410 Panic disorder [episodic paroxysmal anxiety] without agoraphobia: Secondary | ICD-10-CM

## 2024-03-06 DIAGNOSIS — F439 Reaction to severe stress, unspecified: Secondary | ICD-10-CM

## 2024-03-09 ENCOUNTER — Ambulatory Visit (INDEPENDENT_AMBULATORY_CARE_PROVIDER_SITE_OTHER): Admitting: Family Medicine

## 2024-03-09 ENCOUNTER — Encounter: Payer: Self-pay | Admitting: Family Medicine

## 2024-03-09 VITALS — BP 104/72 | HR 75 | Temp 97.6°F | Ht 68.0 in | Wt 173.0 lb

## 2024-03-09 DIAGNOSIS — F439 Reaction to severe stress, unspecified: Secondary | ICD-10-CM | POA: Diagnosis not present

## 2024-03-09 DIAGNOSIS — F32A Depression, unspecified: Secondary | ICD-10-CM

## 2024-03-09 DIAGNOSIS — F410 Panic disorder [episodic paroxysmal anxiety] without agoraphobia: Secondary | ICD-10-CM | POA: Diagnosis not present

## 2024-03-09 DIAGNOSIS — G4700 Insomnia, unspecified: Secondary | ICD-10-CM | POA: Diagnosis not present

## 2024-03-09 DIAGNOSIS — F419 Anxiety disorder, unspecified: Secondary | ICD-10-CM | POA: Diagnosis not present

## 2024-03-09 MED ORDER — ALPRAZOLAM 0.25 MG PO TABS
0.2500 mg | ORAL_TABLET | Freq: Two times a day (BID) | ORAL | 0 refills | Status: DC | PRN
Start: 2024-03-09 — End: 2024-05-31

## 2024-03-09 NOTE — Progress Notes (Signed)
 Subjective:     Patient ID: Erik Mayer, male    DOB: April 21, 1981, 43 y.o.   MRN: [id number]  Chief Complaint  Patient presents with   Medical Management of Chronic Issues    Refill xanax  as needed     HPI  Discussed the use of AI scribe software for clinical note transcription with the patient, who gave verbal consent to proceed.  History of Present Illness Erik Mayer is a 43 year old male who presents for follow-up of anxiety, depression, panic attacks, and insomnia.  Mood disturbance and anxiety symptoms - Anxiety, depression, panic attacks, and insomnia are present. - Escitalopram  has been discontinued. - Alprazolam  is used sparingly. - Trazodone  is used infrequently for sleep, approximately once per month, with limited effectiveness. - Recent increased stress and feelings of being overwhelmed due to purchasing a house requiring significant work. - Work-from-home arrangement contributes to feelings of isolation; seeks social interaction in the evenings.  Upper respiratory and gastrointestinal symptoms - Morning nausea is present, partially attributed to stress. - Postnasal drainage causes coughing and nausea. - Flonase has been used for 1.5 to 2 months without significant relief. - No ear or sinus pain. - Suspects air filter in rented accommodation may contribute to symptoms.  Lifestyle and health maintenance - Occasional alcohol consumption. - Interest in monitoring testosterone levels due to involvement in bodybuilding and fitness. - In a monogamous relationship.      Health Maintenance Due  Topic Date Due   Hepatitis C Screening  Never done   Hepatitis B Vaccines (1 of 3 - 19+ 3-dose series) Never done   HPV VACCINES (1 - 3-dose SCDM series) Never done   COVID-19 Vaccine (3 - 2024-25 season) 04/03/2023   INFLUENZA VACCINE  03/02/2024    Past Medical History:  Diagnosis Date   Allergy    Anxiety and depression 07/14/2022   Encounter for  general adult medical examination with abnormal findings 03/17/2023   Panic attacks 04/01/2022    Past Surgical History:  Procedure Laterality Date   TONSILLECTOMY  08/02/2004    Family History  Problem Relation Age of Onset   Cancer Cousin     Social History   Socioeconomic History   Marital status: Married    Spouse name: Not on file   Number of children: Not on file   Years of education: Not on file   Highest education level: Bachelor's degree (e.g., BA, AB, BS)  Occupational History   Not on file  Tobacco Use   Smoking status: Never   Smokeless tobacco: Never  Substance and Sexual Activity   Alcohol use: Yes    Alcohol/week: 6.0 standard drinks of alcohol    Types: 3 Cans of beer, 3 Shots of liquor per week    Comment: occasionally. 5 drinks a week.   Drug use: No   Sexual activity: Yes  Other Topics Concern   Not on file  Social History Narrative   Not on file   Social Drivers of Health   Financial Resource Strain: Low Risk  (03/08/2024)   Overall Financial Resource Strain (CARDIA)    Difficulty of Paying Living Expenses: Not hard at all  Food Insecurity: No Food Insecurity (03/08/2024)   Hunger Vital Sign    Worried About Running Out of Food in the Last Year: Never true    Ran Out of Food in the Last Year: Never true  Transportation Needs: No Transportation Needs (03/08/2024)   PRAPARE - Transportation    Lack  of Transportation (Medical): No    Lack of Transportation (Non-Medical): No  Physical Activity: Sufficiently Active (03/08/2024)   Exercise Vital Sign    Days of Exercise per Week: 7 days    Minutes of Exercise per Session: 60 min  Stress: Stress Concern Present (03/08/2024)   [employer] of Occupational Health - Occupational Stress Questionnaire    Feeling of Stress: To some extent  Social Connections: Moderately Isolated (03/08/2024)   Social Connection and Isolation Panel    Frequency of Communication with Friends and Family: Never    Frequency of  Social Gatherings with Friends and Family: More than three times a week    Attends Religious Services: Never    Database administrator or Organizations: Yes    Attends Engineer, structural: More than 4 times per year    Marital Status: Separated  Intimate Partner Violence: Not on file    Outpatient Medications Prior to Visit  Medication Sig Dispense Refill   augmented betamethasone dipropionate (DIPROLENE-AF) 0.05 % ointment Apply 1 Application topically 2 (two) times daily.     diclofenac  Sodium (VOLTAREN ) 1 % GEL Apply 2 g topically 4 (four) times daily. 100 g 1   fluconazole  (DIFLUCAN ) 150 MG tablet Take 1 tablet (150 mg total) by mouth once a week. 2 tablet 0   traZODone  (DESYREL ) 50 MG tablet Take 1 tablet (50 mg total) by mouth at bedtime. 90 tablet 0   ALPRAZolam  (XANAX ) 0.25 MG tablet Take 1 tablet (0.25 mg total) by mouth 2 (two) times daily as needed for anxiety. 20 tablet 0   escitalopram  (LEXAPRO ) 10 MG tablet TAKE 1 TABLET (10 MG) BY MOUTH DAILY. 90 tablet 1   nystatin  cream (MYCOSTATIN ) Apply 1 Application topically 2 (two) times daily. 30 g 0   No facility-administered medications prior to visit.    No Known Allergies  ROS Per HPI    Objective:    Physical Exam Constitutional:      General: He is not in acute distress.    Appearance: He is not ill-appearing.  Eyes:     Extraocular Movements: Extraocular movements intact.     Conjunctiva/sclera: Conjunctivae normal.  Cardiovascular:     Rate and Rhythm: Normal rate.  Pulmonary:     Effort: Pulmonary effort is normal.  Musculoskeletal:     Cervical back: Normal range of motion and neck supple.  Skin:    General: Skin is warm and dry.  Neurological:     General: No focal deficit present.     Mental Status: He is alert and oriented to person, place, and time.  Psychiatric:        Mood and Affect: Mood normal.        Behavior: Behavior normal.        Thought Content: Thought content normal.       BP 104/72   Pulse 75   Temp 97.6 F (36.4 C) (Temporal)   Ht 5' 8 (1.727 m)   Wt 173 lb (78.5 kg)   SpO2 98%   BMI 26.30 kg/m  Wt Readings from Last 3 Encounters:  03/09/24 173 lb (78.5 kg)  06/16/23 172 lb 6.4 oz (78.2 kg)  03/17/23 165 lb (74.8 kg)       Assessment & Plan:   Problem List Items Addressed This Visit     Anxiety and depression - Primary   Relevant Medications   ALPRAZolam  (XANAX ) 0.25 MG tablet   Panic attacks   Relevant Medications  ALPRAZolam  (XANAX ) 0.25 MG tablet   Stress   Relevant Medications   ALPRAZolam  (XANAX ) 0.25 MG tablet   Other Visit Diagnoses       Insomnia, unspecified type          Assessment and Plan Assessment & Plan Anxiety, depression, and panic disorder Anxiety and depression are well-managed with minimal alprazolam  use. Stress has increased due to a recent house purchase and renovations, but he manages without frequent medication. He has weaned off escitalopram  and reports improvement without it. - Continue alprazolam  0.25 MG orally as needed for anxiety.  Insomnia Insomnia persists, especially during stress. Trazodone , used sporadically, is ineffective as needed. It may be more effective if taken consistently. Trazodone  is non-habit-forming and can be taken nightly if needed, with at least 7 hours dedicated to sleep. - Consider consistent trazodone  use during stress or anticipated sleep difficulty. - Ensure at least 7 hours of sleep opportunity when taking trazodone .  Postnasal drip with morning nausea Postnasal drip likely causes morning nausea. Flonase was ineffective. Allergies and environmental factors, such as air filter issues, are possible causes. Alcohol consumption before bed may contribute to nausea. - Discontinue Flonase. - Start Zyrtec at bedtime to manage postnasal drip and improve sleep. - Consider reducing alcohol intake before bed.    I have discontinued Haydon Daris's escitalopram  and  nystatin  cream. I am also having him maintain his diclofenac  Sodium, augmented betamethasone dipropionate, traZODone , fluconazole , and ALPRAZolam .  Meds ordered this encounter  Medications   ALPRAZolam  (XANAX ) 0.25 MG tablet    Sig: Take 1 tablet (0.25 mg total) by mouth 2 (two) times daily as needed for anxiety.    Dispense:  20 tablet    Refill:  0    Supervising Provider:   ROLLENE NORRIS A [[id number]]

## 2024-03-27 ENCOUNTER — Encounter: Payer: Self-pay | Admitting: Family Medicine

## 2024-03-27 DIAGNOSIS — G4700 Insomnia, unspecified: Secondary | ICD-10-CM

## 2024-03-27 MED ORDER — TRAZODONE HCL 50 MG PO TABS: 50.0000 mg | ORAL_TABLET | Freq: Every day | ORAL | 0 refills | Status: AC

## 2024-04-10 ENCOUNTER — Ambulatory Visit (INDEPENDENT_AMBULATORY_CARE_PROVIDER_SITE_OTHER): Admitting: Family Medicine

## 2024-04-10 ENCOUNTER — Encounter: Payer: Self-pay | Admitting: Family Medicine

## 2024-04-10 VITALS — BP 102/68 | HR 64 | Temp 97.8°F | Ht 68.0 in | Wt 170.0 lb

## 2024-04-10 DIAGNOSIS — R6882 Decreased libido: Secondary | ICD-10-CM

## 2024-04-10 DIAGNOSIS — F419 Anxiety disorder, unspecified: Secondary | ICD-10-CM | POA: Diagnosis not present

## 2024-04-10 DIAGNOSIS — F32A Depression, unspecified: Secondary | ICD-10-CM

## 2024-04-10 DIAGNOSIS — Z23 Encounter for immunization: Secondary | ICD-10-CM | POA: Diagnosis not present

## 2024-04-10 DIAGNOSIS — Z0001 Encounter for general adult medical examination with abnormal findings: Secondary | ICD-10-CM | POA: Diagnosis not present

## 2024-04-10 DIAGNOSIS — Z1159 Encounter for screening for other viral diseases: Secondary | ICD-10-CM

## 2024-04-10 DIAGNOSIS — G4700 Insomnia, unspecified: Secondary | ICD-10-CM | POA: Diagnosis not present

## 2024-04-10 DIAGNOSIS — E7800 Pure hypercholesterolemia, unspecified: Secondary | ICD-10-CM | POA: Diagnosis not present

## 2024-04-10 DIAGNOSIS — Z113 Encounter for screening for infections with a predominantly sexual mode of transmission: Secondary | ICD-10-CM

## 2024-04-10 LAB — CBC WITH DIFFERENTIAL/PLATELET
Basophils Absolute: 0 K/uL (ref 0.0–0.1)
Basophils Relative: 0.5 % (ref 0.0–3.0)
Eosinophils Absolute: 0.1 K/uL (ref 0.0–0.7)
Eosinophils Relative: 1.7 % (ref 0.0–5.0)
HCT: 49.3 % (ref 39.0–52.0)
Hemoglobin: 16.4 g/dL (ref 13.0–17.0)
Lymphocytes Relative: 28.1 % (ref 12.0–46.0)
Lymphs Abs: 1.2 K/uL (ref 0.7–4.0)
MCHC: 33.2 g/dL (ref 30.0–36.0)
MCV: 86.1 fl (ref 78.0–100.0)
Monocytes Absolute: 0.4 K/uL (ref 0.1–1.0)
Monocytes Relative: 10.4 % (ref 3.0–12.0)
Neutro Abs: 2.5 K/uL (ref 1.4–7.7)
Neutrophils Relative %: 59.3 % (ref 43.0–77.0)
Platelets: 164 K/uL (ref 150.0–400.0)
RBC: 5.73 Mil/uL (ref 4.22–5.81)
RDW: 13.3 % (ref 11.5–15.5)
WBC: 4.1 K/uL (ref 4.0–10.5)

## 2024-04-10 LAB — COMPREHENSIVE METABOLIC PANEL WITH GFR
ALT: 20 U/L (ref 0–53)
AST: 22 U/L (ref 0–37)
Albumin: 4.5 g/dL (ref 3.5–5.2)
Alkaline Phosphatase: 24 U/L — ABNORMAL LOW (ref 39–117)
BUN: 17 mg/dL (ref 6–23)
CO2: 28 meq/L (ref 19–32)
Calcium: 9.5 mg/dL (ref 8.4–10.5)
Chloride: 106 meq/L (ref 96–112)
Creatinine, Ser: 1.12 mg/dL (ref 0.40–1.50)
GFR: 80.16 mL/min (ref >60.00)
Glucose, Bld: 88 mg/dL (ref 70–99)
Potassium: 4.1 meq/L (ref 3.5–5.1)
Sodium: 141 meq/L (ref 135–145)
Total Bilirubin: 0.7 mg/dL (ref 0.2–1.2)
Total Protein: 6.9 g/dL (ref 6.0–8.3)

## 2024-04-10 LAB — LIPID PANEL
Cholesterol: 172 mg/dL (ref 0–200)
HDL: 69.8 mg/dL (ref >39.00)
LDL Cholesterol: 92 mg/dL (ref 0–99)
NonHDL: 101.74
Total CHOL/HDL Ratio: 2
Triglycerides: 47 mg/dL (ref 0.0–149.0)
VLDL: 9.4 mg/dL (ref 0.0–40.0)

## 2024-04-10 LAB — TSH: TSH: 0.26 u[IU]/mL — ABNORMAL LOW (ref 0.35–5.50)

## 2024-04-10 NOTE — Progress Notes (Signed)
 Complete physical exam  Patient: Erik Mayer   DOB: [birth year]   43 y.o. Male  MRN: [id number]  Subjective:    Chief Complaint  Patient presents with   Annual Exam    fasting   He is here for a complete physical exam.   Discussed the use of AI scribe software for clinical note transcription with the patient, who gave verbal consent to proceed.   History of Present Illness Erik Mayer is a 43 year old male who presents for his annual physical exam.  Knee arthralgia - Knee discomfort with increased difficulty during transitions such as standing up and sitting down - Standing is easier than sitting - Recent increase in physical activity due to numerous house projects, which may contribute to symptoms  Shoulder pain - Tender spot on shoulder, likely from minor injury - No restriction in range of motion  Dyslipidemia - Elevated LDL cholesterol - Present for routine monitoring - Fasted for blood work today, only consumed water  Mood disturbance and anxiety - Insomnia, anxiety, and depression with occasional panic attacks - Increased stress and feeling overwhelmed due to recent life changes, including moving into a new house and increased financial responsibilities - managing ok   Irregular eating patterns and weight loss - Irregular eating habits due to busy schedule, sometimes forgetting to eat - Attempts to eat healthily when possible - Slight decrease in weight attributed to busy lifestyle  Preventive health concerns - Considering screening for sexually transmitted infections due to lifestyle - Curious about testosterone levels. decreased libido without erectile dysfunction    Health Maintenance  Topic Date Due   Hepatitis C Screening  Never done   Hepatitis B Vaccine (1 of 3 - 19+ 3-dose series) Never done   HPV Vaccine (1 - 3-dose SCDM series) Never done   COVID-19 Vaccine (3 - 2025-26 season) 04/02/2024   DTaP/Tdap/Td vaccine (2 - Td or Tdap)  10/28/2026   Flu Shot  Completed   HIV Screening  Completed   Pneumococcal Vaccine  Aged Out   Meningitis B Vaccine  Aged Out    Wears seatbelt always, uses sunscreen, smoke detectors in home and functioning, does not text while driving, feels safe in home environment.  Depression screening:    04/10/2024    9:28 AM 06/16/2023    4:24 PM 03/17/2023    8:45 AM  Depression screen PHQ 2/9  Decreased Interest 0 3 3  Down, Depressed, Hopeless 0 3 3  PHQ - 2 Score 0 6 6  Altered sleeping 1 3 3   Tired, decreased energy 1 3 3   Change in appetite 1 3 3   Feeling bad or failure about yourself  0 3 3  Trouble concentrating 0 3 3  Moving slowly or fidgety/restless 0 3 3  Suicidal thoughts 0 0 0  PHQ-9 Score 3 24 24   Difficult doing work/chores Not difficult at all Somewhat difficult Very difficult   Anxiety Screening:     No data to display           Patient Care Team: Lendia Boby CROME, NP-C as PCP - General (Family Medicine)   Outpatient Medications Prior to Visit  Medication Sig   ALPRAZolam  (XANAX ) 0.25 MG tablet Take 1 tablet (0.25 mg total) by mouth 2 (two) times daily as needed for anxiety.   augmented betamethasone dipropionate (DIPROLENE-AF) 0.05 % ointment Apply 1 Application topically 2 (two) times daily.   diclofenac  Sodium (VOLTAREN ) 1 % GEL Apply 2 g topically 4 (four)  times daily.   fluconazole  (DIFLUCAN ) 150 MG tablet Take 1 tablet (150 mg total) by mouth once a week.   traZODone  (DESYREL ) 50 MG tablet Take 1 tablet (50 mg total) by mouth at bedtime.   No facility-administered medications prior to visit.    Review of Systems  Constitutional:  Negative for chills, fever, malaise/fatigue and weight loss.  HENT:  Negative for congestion, ear pain, sinus pain and sore throat.   Eyes:  Negative for blurred vision, double vision and pain.  Respiratory:  Negative for cough, shortness of breath and wheezing.   Cardiovascular:  Negative for chest pain, palpitations and  leg swelling.  Gastrointestinal:  Negative for abdominal pain, constipation, diarrhea, nausea and vomiting.  Genitourinary:  Negative for dysuria, frequency and urgency.  Musculoskeletal:  Negative for back pain, joint pain and myalgias.  Skin:  Negative for rash.  Neurological:  Negative for dizziness, tingling, focal weakness and headaches.  Psychiatric/Behavioral:  Negative for depression and suicidal ideas. The patient is not nervous/anxious.        Objective:    BP 102/68   Pulse 64   Temp 97.8 F (36.6 C) (Temporal)   Ht 5' 8 (1.727 m)   Wt 170 lb (77.1 kg)   SpO2 98%   BMI 25.85 kg/m  BP Readings from Last 3 Encounters:  04/10/24 102/68  03/09/24 104/72  06/16/23 118/72   Wt Readings from Last 3 Encounters:  04/10/24 170 lb (77.1 kg)  03/09/24 173 lb (78.5 kg)  06/16/23 172 lb 6.4 oz (78.2 kg)    Physical Exam Constitutional:      General: He is not in acute distress.    Appearance: He is not ill-appearing.  HENT:     Right Ear: Tympanic membrane, ear canal and external ear normal.     Left Ear: Tympanic membrane, ear canal and external ear normal.     Nose: Nose normal.     Mouth/Throat:     Mouth: Mucous membranes are moist.     Pharynx: Oropharynx is clear.  Eyes:     Extraocular Movements: Extraocular movements intact.     Conjunctiva/sclera: Conjunctivae normal.     Pupils: Pupils are equal, round, and reactive to light.  Neck:     Thyroid : No thyroid  mass, thyromegaly or thyroid  tenderness.  Cardiovascular:     Rate and Rhythm: Normal rate and regular rhythm.     Pulses: Normal pulses.     Heart sounds: Normal heart sounds.  Pulmonary:     Effort: Pulmonary effort is normal.     Breath sounds: Normal breath sounds.  Abdominal:     General: Bowel sounds are normal. There is no distension.     Palpations: Abdomen is soft.     Tenderness: There is no abdominal tenderness. There is no right CVA tenderness, left CVA tenderness, guarding or rebound.   Musculoskeletal:        General: Normal range of motion.     Cervical back: Normal range of motion and neck supple. No tenderness.     Right lower leg: No edema.     Left lower leg: No edema.  Lymphadenopathy:     Cervical: No cervical adenopathy.  Skin:    General: Skin is warm and dry.     Findings: No lesion or rash.  Neurological:     General: No focal deficit present.     Mental Status: He is alert and oriented to person, place, and time.  Cranial Nerves: No cranial nerve deficit.     Sensory: No sensory deficit.     Motor: No weakness.  Psychiatric:        Mood and Affect: Mood normal.        Behavior: Behavior normal.        Thought Content: Thought content normal.      Results for orders placed or performed in visit on 04/10/24  Lipid panel  Result Value Ref Range   Cholesterol 172 0 - 200 mg/dL   Triglycerides 52.9 0.0 - 149.0 mg/dL   HDL 30.19 >60.99 mg/dL   VLDL 9.4 0.0 - 59.9 mg/dL   LDL Cholesterol 92 0 - 99 mg/dL   Total CHOL/HDL Ratio 2    NonHDL 101.74   TSH  Result Value Ref Range   TSH 0.26 (L) 0.35 - 5.50 uIU/mL  Comprehensive metabolic panel with GFR  Result Value Ref Range   Sodium 141 135 - 145 mEq/L   Potassium 4.1 3.5 - 5.1 mEq/L   Chloride 106 96 - 112 mEq/L   CO2 28 19 - 32 mEq/L   Glucose, Bld 88 70 - 99 mg/dL   BUN 17 6 - 23 mg/dL   Creatinine, Ser 8.87 0.40 - 1.50 mg/dL   Total Bilirubin 0.7 0.2 - 1.2 mg/dL   Alkaline Phosphatase 24 (L) 39 - 117 U/L   AST 22 0 - 37 U/L   ALT 20 0 - 53 U/L   Total Protein 6.9 6.0 - 8.3 g/dL   Albumin 4.5 3.5 - 5.2 g/dL   GFR 19.83 >39.99 mL/min   Calcium 9.5 8.4 - 10.5 mg/dL  CBC with Differential/Platelet  Result Value Ref Range   WBC 4.1 4.0 - 10.5 K/uL   RBC 5.73 4.22 - 5.81 Mil/uL   Hemoglobin 16.4 13.0 - 17.0 g/dL   HCT 50.6 60.9 - 47.9 %   MCV 86.1 78.0 - 100.0 fl   MCHC 33.2 30.0 - 36.0 g/dL   RDW 86.6 88.4 - 84.4 %   Platelets 164.0 150.0 - 400.0 K/uL   Neutrophils Relative % 59.3  43.0 - 77.0 %   Lymphocytes Relative 28.1 12.0 - 46.0 %   Monocytes Relative 10.4 3.0 - 12.0 %   Eosinophils Relative 1.7 0.0 - 5.0 %   Basophils Relative 0.5 0.0 - 3.0 %   Neutro Abs 2.5 1.4 - 7.7 K/uL   Lymphs Abs 1.2 0.7 - 4.0 K/uL   Monocytes Absolute 0.4 0.1 - 1.0 K/uL   Eosinophils Absolute 0.1 0.0 - 0.7 K/uL   Basophils Absolute 0.0 0.0 - 0.1 K/uL      Assessment & Plan:    Routine Health Maintenance and Physical Exam Problem List Items Addressed This Visit     Anxiety and depression   Relevant Orders   TSH (Completed)   Encounter for general adult medical examination with abnormal findings - Primary   Relevant Orders   CBC with Differential/Platelet (Completed)   Comprehensive metabolic panel with GFR (Completed)   Other Visit Diagnoses       Need for influenza vaccination       Relevant Orders   Flu vaccine trivalent PF, 6mos and older(Flulaval,Afluria,Fluarix,Fluzone) (Completed)     Elevated LDL cholesterol level       Relevant Orders   Lipid panel (Completed)     Insomnia, unspecified type       Relevant Orders   TSH (Completed)     Encounter for screening for other viral diseases  Relevant Orders   Hepatitis C antibody     Decreased libido without sexual dysfunction       Relevant Orders   Testosterone , Free and Total     Screen for STD (sexually transmitted disease)       Relevant Orders   Hepatitis B surface antigen   GC/Chlamydia Probe Amp   HIV Antibody (routine testing w rflx)   RPR       Assessment and Plan Assessment & Plan Adult Wellness Visit Annual physical exam conducted. He is busy with home renovation projects and has experienced weight loss due to decreased eating priority. No family history of colon or prostate cancer. No changes in bowel habits or blood in stool. Parents are alive and healthy, though father may have undiagnosed dementia. Grandparents lived into late 80s to mid-90s. He is active, though has not been to the gym  due to busy schedule. Regular dental and eye check-ups maintained. - Perform blood work including hepatitis C screening - Screen for colon cancer starting at age 86 - Continue regular dental and eye check-ups - Encourage healthy eating habits and hydration  Pure hypercholesterolemia Elevated LDL levels in the past.  - check fasting lipids   Insomnia, anxiety, and depression with panic attacks Reports feeling stressed and overwhelmed due to recent move and financial concerns. Experiences occasional panic-like feelings but manages by adjusting budget and lifestyle. Anticipates improvement as he settles into new routine.  Screen for STDs Not married or monogamous and has expressed interest in STD screening.  - Perform STD screening   Decreased libido Reports possible decreased libido but no erectile dysfunction. Curious about testosterone levels and potential link to depression symptoms.  - Check testosterone level as requested      Return in about 1 year (around 04/10/2025) for fasting CPE.     Boby Mackintosh, NP-C

## 2024-04-12 ENCOUNTER — Ambulatory Visit: Payer: Self-pay | Admitting: Family Medicine

## 2024-04-12 ENCOUNTER — Other Ambulatory Visit: Payer: Self-pay | Admitting: Family Medicine

## 2024-04-12 DIAGNOSIS — E0590 Thyrotoxicosis, unspecified without thyrotoxic crisis or storm: Secondary | ICD-10-CM

## 2024-04-12 NOTE — Progress Notes (Signed)
 His thyroid  hormone is low which means too much hormone (hyperthyroid). I would like to order a thyroid  ultrasound to take a look at his thyroid  gland. He will receive a call to get this done. I am still waiting on his other lab results and will let him know once I have those.

## 2024-04-14 LAB — GC/CHLAMYDIA PROBE AMP: Neisseria Gonorrhoeae by PCR: NEGATIVE

## 2024-04-16 ENCOUNTER — Other Ambulatory Visit

## 2024-04-16 LAB — HEPATITIS C ANTIBODY: Hepatitis C Ab: NONREACTIVE

## 2024-04-16 LAB — TESTOSTERONE, FREE & TOTAL
Free Testosterone: 98.5 pg/mL (ref 35.0–155.0)
Testosterone, Total, LC-MS-MS: 557 ng/dL (ref 250–1100)

## 2024-04-16 LAB — HIV ANTIBODY (ROUTINE TESTING W REFLEX)
HIV 1&2 Ab, 4th Generation: NONREACTIVE
HIV FINAL INTERPRETATION: NEGATIVE

## 2024-04-16 LAB — HEPATITIS B SURFACE ANTIGEN: Hepatitis B Surface Ag: NONREACTIVE

## 2024-04-16 LAB — RPR: RPR Ser Ql: NONREACTIVE

## 2024-04-17 ENCOUNTER — Ambulatory Visit
Admission: RE | Admit: 2024-04-17 | Discharge: 2024-04-17 | Disposition: A | Discharge status: Home / Self Care | Source: Ambulatory Visit | Attending: Family Medicine | Admitting: Family Medicine

## 2024-04-17 DIAGNOSIS — E0590 Thyrotoxicosis, unspecified without thyrotoxic crisis or storm: Secondary | ICD-10-CM

## 2024-04-18 ENCOUNTER — Ambulatory Visit: Payer: Self-pay | Admitting: Family Medicine

## 2024-04-20 ENCOUNTER — Other Ambulatory Visit: Payer: Self-pay | Admitting: Family Medicine

## 2024-04-20 DIAGNOSIS — E0590 Thyrotoxicosis, unspecified without thyrotoxic crisis or storm: Secondary | ICD-10-CM

## 2024-04-20 DIAGNOSIS — E041 Nontoxic single thyroid nodule: Secondary | ICD-10-CM

## 2024-05-17 ENCOUNTER — Ambulatory Visit (INDEPENDENT_AMBULATORY_CARE_PROVIDER_SITE_OTHER): Admitting: Endocrinology

## 2024-05-17 ENCOUNTER — Other Ambulatory Visit

## 2024-05-17 ENCOUNTER — Encounter: Payer: Self-pay | Admitting: Endocrinology

## 2024-05-17 VITALS — BP 106/80 | HR 83 | Resp 20 | Ht 68.0 in | Wt 171.6 lb

## 2024-05-17 DIAGNOSIS — E0590 Thyrotoxicosis, unspecified without thyrotoxic crisis or storm: Secondary | ICD-10-CM

## 2024-05-17 DIAGNOSIS — E041 Nontoxic single thyroid nodule: Secondary | ICD-10-CM | POA: Diagnosis not present

## 2024-05-17 NOTE — Patient Instructions (Signed)
Methimazole Tablets What is this medication? METHIMAZOLE (meth IM a zole) treats high thyroid levels (hyperthyroidism) in your body. It works by decreasing the amount of thyroid hormone your body makes. This medicine may be used for other purposes; ask your health care provider or pharmacist if you have questions. COMMON BRAND NAME(S): Northyx, Tapazole What should I tell my care team before I take this medication? They need to know if you have any of these conditions: Liver disease Low white blood cell levels An unusual or allergic reaction to methimazole, other medications, foods, dyes, or preservatives Pregnant or trying to get pregnant Breastfeeding How should I use this medication? Take this medication by mouth with a glass of water. Follow the directions on the prescription label. You can take this medication with or without food. However, you should always take it the same way to make sure the effects are the same. Take your doses at regular intervals. Do not take your medication more often than directed. Do not stop taking this medication except on the advice of your care team. Talk to your care team about the use of this medication in children. Special care may be needed. While this medication may be prescribed for children for selected conditions, precautions do apply. Overdosage: If you think you have taken too much of this medicine contact a poison control center or emergency room at once. NOTE: This medicine is only for you. Do not share this medicine with others. What if I miss a dose? If you miss a dose, take it as soon as you can. If it is almost time for your next dose, take only that dose. Do not take double or extra doses. What may interact with this medication? Aminophylline Certain medications for blood pressure, heart disease, or irregular heartbeat, such as metoprolol and propranolol Digoxin Theophylline Warfarin This list may not describe all possible interactions.  Give your health care provider a list of all the medicines, herbs, non-prescription drugs, or dietary supplements you use. Also tell them if you smoke, drink alcohol, or use illegal drugs. Some items may interact with your medicine. What should I watch for while using this medication? Visit your care team for regular checks on your progress. Tell your care team if your symptoms do not start to get better or if they get worse. It may be some time before you see the benefit from this medication. You may need blood work done while you are taking this medication. Biotin (vitamin B7) may interfere with your thyroid function test. Stop taking supplements that contain biotin 2 days before your blood work. This medication may increase your risk of getting an infection. Call your care team for advice if you get a fever, chills, sore throat, or other symptoms of a cold or flu. Do not treat yourself. Try to avoid being around people who are sick. Talk to your care team if you may be pregnant. Serious birth defects can occur if you take this medication during pregnancy. If you are going to need surgery or a procedure, tell your care team that you are taking this medication. What side effects may I notice from receiving this medication? Side effects that you should report to your care team as soon as possible: Allergic reactions--skin rash, itching, hives, swelling of the face, lips, tongue, or throat Infection--fever, chills, cough, or sore throat Liver injury--right upper belly pain, loss of appetite, nausea, light-colored stool, dark yellow or brown urine, yellowing skin or eyes, unusual weakness or fatigue Low  thyroid levels (hypothyroidism)--unusual weakness or fatigue, increased sensitivity to cold, constipation, hair loss, dry skin, weight gain, feelings of depression Unusual weakness or fatigue, fever, headache, skin rash, muscle or joint pain, loss of appetite, pain, tingling, or numbness in the hands or  feet Side effects that usually do not require medical attention (report to your care team if they continue or are bothersome): Change in taste Dizziness Hair loss Headache Nausea Upset stomach This list may not describe all possible side effects. Call your doctor for medical advice about side effects. You may report side effects to FDA at 1-800-FDA-1088. Where should I keep my medication? Keep out of the reach of children. Store at room temperature between 20 and 25 degrees C (68 and 77 degrees F). Keep tightly closed. Protect from light. Throw away any unused medication after the expiration date. NOTE: This sheet is a summary. It may not cover all possible information. If you have questions about this medicine, talk to your doctor, pharmacist, or health care provider.  2024 [employer] Standard (2022-10-08 00:00:00)

## 2024-05-17 NOTE — Progress Notes (Signed)
 Outpatient Endocrinology Note [place] Erik Scheel, MD   Patient's Name: Erik Mayer    DOB: 08/26/1980    MRN: [id number]  REASON OF VISIT: New consult for hyperthyroidism / thyroid  nodule  REFERRING PROVIDER: Lendia Boby CROME, NP-C  PCP: Erik Boby CROME, NP-C  HISTORY OF PRESENT ILLNESS:   Erik Mayer is a 43 y.o. old male with past medical history as listed below is presented for new consult for hyperthyroidism.   Pertinent Thyroid  History: Patient is referred to endocrinology for evaluation and management of hyperthyroidism and small left thyroid  nodule.  Initial consult on May 17, 2024.  Patient has complaints of becoming anxious, more irritable, sleep disturbance, irritable lately and frequent bowel movement.  He has mild weight loss.  He has increased hunger and heat intolerance/increased sweating.  He has noticed these symptoms for about 6 months.  He was evaluated by primary care provider in April 10, 2024 had thyroid  function test found to have mildly low TSH of 0.26 with low normal limit of 0.35.  At that time he also had testosterone  levels checked were in normal range.  He had ultrasound thyroid  on April 17, 2024 due to low TSH reviewed images overall mild heterogenous thyroid  parenchyma with tiny left  mildly hypoechoic thyroid  nodule measuring 0.33 cm.  No worrisome features.  No significantly large solid nodule noted.  EXAM: On April 17, 2024 reviewed images. THYROID  ULTRASOUND   TECHNIQUE: Ultrasound examination of the thyroid  gland and adjacent soft tissues was performed.   COMPARISON:  None Available.   FINDINGS: Parenchymal Echotexture: Mildly heterogenous   Isthmus: 3 mm   Right lobe: 6.3 x 1.4 x 2.0 cm   Left lobe: 5.9 x 1.7 x 2.0 cm   _________________________________________________________   Estimated total number of nodules >/= 1 cm: 0   Number of spongiform nodules >/=  2 cm not described below (TR1): 0   Number of  mixed cystic and solid nodules >/= 1.5 cm not described below (TR2): 0   _________________________________________________________   Mild thyroid  heterogeneity and enlargement compatible with medical thyroid  disease. Normal vascularity. Incidental subcentimeter hypoechoic TR 4 type nodule in the left mid thyroid  measures only 3 mm. No follow-up or biopsy recommended. No regional adenopathy.   IMPRESSION: Mild thyroid  heterogeneity and enlargement compatible with medical thyroid  disease.   No other significant abnormality by ultrasound.  -He has noticed some blurred vision otherwise no redness or watering of the eyes. - No IV iodine contrast use, no amiodarone use and no sickness at the time of blood test. - No anterior neck compressive symptoms. - No known family history of thyroid  disorder.  - Labs:   Latest Reference Range & Units 03/17/23 09:36 04/10/24 09:53  TSH 0.35 - 5.50 uIU/mL 0.68 0.26 (L)  (L): Data is abnormally low  REVIEW OF SYSTEMS:  As per history of present illness.   PAST MEDICAL HISTORY: Past Medical History:  Diagnosis Date   Allergy    Anxiety and depression 07/14/2022   Encounter for general adult medical examination with abnormal findings 03/17/2023   Panic attacks 04/01/2022    PAST SURGICAL HISTORY: Past Surgical History:  Procedure Laterality Date   TONSILLECTOMY  08/02/2004    ALLERGIES: No Known Allergies  FAMILY HISTORY:  Family History  Problem Relation Age of Onset   Cancer Cousin     SOCIAL HISTORY: Social History   Socioeconomic History   Marital status: Married    Spouse name: Not on file   Number of children:  Not on file   Years of education: Not on file   Highest education level: Bachelor's degree (e.g., BA, AB, BS)  Occupational History   Not on file  Tobacco Use   Smoking status: Never   Smokeless tobacco: Never  Substance and Sexual Activity   Alcohol use: Yes    Alcohol/week: 6.0 standard drinks of alcohol     Types: 3 Cans of beer, 3 Shots of liquor per week    Comment: occasionally. 5 drinks a week.   Drug use: No   Sexual activity: Yes  Other Topics Concern   Not on file  Social History Narrative   Not on file   Social Drivers of Health   Financial Resource Strain: Low Risk  (03/08/2024)   Overall Financial Resource Strain (CARDIA)    Difficulty of Paying Living Expenses: Not hard at all  Food Insecurity: No Food Insecurity (03/08/2024)   Hunger Vital Sign    Worried About Running Out of Food in the Last Year: Never true    Ran Out of Food in the Last Year: Never true  Transportation Needs: No Transportation Needs (03/08/2024)   PRAPARE - Administrator, Civil Service (Medical): No    Lack of Transportation (Non-Medical): No  Physical Activity: Sufficiently Active (03/08/2024)   Exercise Vital Sign    Days of Exercise per Week: 7 days    Minutes of Exercise per Session: 60 min  Stress: Stress Concern Present (03/08/2024)   [employer] of Occupational Health - Occupational Stress Questionnaire    Feeling of Stress: To some extent  Social Connections: Moderately Isolated (03/08/2024)   Social Connection and Isolation Panel    Frequency of Communication with Friends and Family: Never    Frequency of Social Gatherings with Friends and Family: More than three times a week    Attends Religious Services: Never    Database administrator or Organizations: Yes    Attends Engineer, structural: More than 4 times per year    Marital Status: Separated    MEDICATIONS:  Current Outpatient Medications  Medication Sig Dispense Refill   ALPRAZolam  (XANAX ) 0.25 MG tablet Take 1 tablet (0.25 mg total) by mouth 2 (two) times daily as needed for anxiety. 20 tablet 0   augmented betamethasone dipropionate (DIPROLENE-AF) 0.05 % ointment Apply 1 Application topically 2 (two) times daily.     traZODone  (DESYREL ) 50 MG tablet Take 1 tablet (50 mg total) by mouth at bedtime. 90 tablet 0    No current facility-administered medications for this visit.    PHYSICAL EXAM: Vitals:   05/17/24 1310  BP: 106/80  Pulse: 83  Resp: 20  SpO2: 99%  Weight: 171 lb 9.6 oz (77.8 kg)  Height: 5' 8 (1.727 m)   Body mass index is 26.09 kg/m.  Wt Readings from Last 3 Encounters:  05/17/24 171 lb 9.6 oz (77.8 kg)  04/10/24 170 lb (77.1 kg)  03/09/24 173 lb (78.5 kg)    General: Well developed, well nourished male in no apparent distress.  HEENT: AT/[place], no external lesions. Hearing intact to the spoken word Eyes: No stare, proptosis or lid lag. Conjunctiva clear and no icterus. No erythema or watering Neck: Trachea midline, neck supple without appreciable thyromegaly or lymphadenopathy and no palpable thyroid  nodules Lungs: Clear to auscultation, no wheeze. Respirations not labored Heart: S1S2, Regular in rate and rhythm.  Abdomen: Soft, non tender, non distended Neurologic: Alert, oriented, normal speech, deep tendon biceps reflexes 2-3+,  no gross focal neurological deficit Extremities: No pedal pitting edema, no tremors of outstretched hands Skin: Warm, color good.  Psychiatric: Does not appear depressed or anxious  PERTINENT HISTORIC LABORATORY AND IMAGING STUDIES:  All pertinent laboratory results were reviewed. Please see HPI also for further details.   TSH  Date Value Ref Range Status  04/10/2024 0.26 (L) 0.35 - 5.50 uIU/mL Final  03/17/2023 0.68 0.35 - 5.50 uIU/mL Final  04/01/2022 1.64 0.35 - 5.50 uIU/mL Final    Lab Results  Component Value Date   FREET4 1.04 04/01/2022   TSH 0.26 (L) 04/10/2024   TSH 0.68 03/17/2023   TSH 1.64 04/01/2022    No results found for: THYROTRECAB  Lab Results  Component Value Date   TSH 0.26 (L) 04/10/2024   TSH 0.68 03/17/2023   TSH 1.64 04/01/2022   FREET4 1.04 04/01/2022     No results found for: TSI   No components found for: TRAB    ASSESSMENT / PLAN  1. Hyperthyroidism   2. Thyroid  nodule    Patient had  low TSH suggestive of hyperthyroidism with clinical features of hyperthyroidism.  Etiology is unclear at this time.  Discussed that it can be transient related to thyroiditis.  It is less likely to be related to hyperfunctioning thyroid  nodule as recent ultrasound thyroid  showed tiny left thyroid  nodule measuring 0.33 cm.  Other possibility is to have autoimmune hyperthyroidism/Graves' disease.  He needs additional laboratory test as planned below.  Discussed that if he turn out to be Graves' disease he needs antithyroid medication called methimazole, discussed potential side effects including bone marrow suppression and liver toxicity and allergic reaction.  Provided written instruction.  Also provided ATA handout about hyperthyroidism.  Plan: - I would like to check thyroid  function test with TSH, free T4 and free T3. - Check thyroid  autoantibodies for Graves' disease with TRAb and TSI and autoantibodies for Hashimoto thyroiditis with anti-TPO antibody and thyroglobulin antibody. - Consider radioactive iodine uptake and scan in the future if needed.  No plan at this time. - Small thyroid  nodule on left measuring 0.33 cm no worrisome features no biopsy is indicated at this time.  Can be monitored with serial ultrasound, will plan to do ultrasound in 1 to 2 years. - Rest of the plan based on test results from today.   Diagnoses and all orders for this visit:  Hyperthyroidism -     T3, free -     T4, free -     TRAb (TSH Receptor Binding Antibody) -     Thyroid  stimulating immunoglobulin -     Thyroglobulin antibody -     Thyroid  peroxidase antibody  Thyroid  nodule   DISPOSITION Follow up in clinic in 2 months suggested.  All questions answered and patient verbalized understanding of the plan.  [place] Olsen Mccutchan, MD [facility] [address] [place], [address] [place], [place] [address] Phone # [phone]   At least part of this note was generated using  voice recognition software. Inadvertent word errors may have occurred, which were not recognized during the proofreading process.

## 2024-05-18 ENCOUNTER — Other Ambulatory Visit: Payer: Self-pay | Admitting: Endocrinology

## 2024-05-18 DIAGNOSIS — E0590 Thyrotoxicosis, unspecified without thyrotoxic crisis or storm: Secondary | ICD-10-CM

## 2024-05-21 ENCOUNTER — Ambulatory Visit: Payer: Self-pay | Admitting: Endocrinology

## 2024-05-21 DIAGNOSIS — E0590 Thyrotoxicosis, unspecified without thyrotoxic crisis or storm: Secondary | ICD-10-CM

## 2024-05-21 LAB — T4, FREE: Free T4: 1.3 ng/dL (ref 0.8–1.8)

## 2024-05-21 LAB — THYROID STIMULATING IMMUNOGLOBULIN: TSI: <89 %{baseline} (ref <140)

## 2024-05-21 LAB — THYROID PEROXIDASE ANTIBODY: Thyroperoxidase Ab SerPl-aCnc: <1 [IU]/mL (ref <9)

## 2024-05-21 LAB — TEST AUTHORIZATION

## 2024-05-21 LAB — TRAB (TSH RECEPTOR BINDING ANTIBODY): TRAB: <1 IU/L (ref <=2.00)

## 2024-05-21 LAB — T3, FREE: T3, Free: 4.1 pg/mL (ref 2.3–4.2)

## 2024-05-21 LAB — THYROGLOBULIN ANTIBODY: Thyroglobulin Ab: <1 [IU]/mL (ref <=1)

## 2024-05-21 LAB — TSH: TSH: 0.92 m[IU]/L (ref 0.40–4.50)

## 2024-05-31 ENCOUNTER — Other Ambulatory Visit: Payer: Self-pay | Admitting: Family Medicine

## 2024-05-31 DIAGNOSIS — F410 Panic disorder [episodic paroxysmal anxiety] without agoraphobia: Secondary | ICD-10-CM

## 2024-05-31 DIAGNOSIS — F439 Reaction to severe stress, unspecified: Secondary | ICD-10-CM

## 2024-05-31 NOTE — Telephone Encounter (Signed)
 LOV: 04/10/24 Last fill: 03/09/24, 20 tablet 0 refill

## 2024-07-04 LAB — TSH: TSH: 0.64 m[IU]/L (ref 0.40–4.50)

## 2024-07-04 LAB — T3, FREE: T3, Free: 3.6 pg/mL (ref 2.3–4.2)

## 2024-07-04 LAB — T4, FREE: Free T4: 1.2 ng/dL (ref 0.8–1.8)

## 2024-07-10 ENCOUNTER — Ambulatory Visit (INDEPENDENT_AMBULATORY_CARE_PROVIDER_SITE_OTHER): Admitting: Endocrinology

## 2024-07-10 ENCOUNTER — Encounter: Payer: Self-pay | Admitting: Endocrinology

## 2024-07-10 VITALS — BP 112/72 | HR 68 | Resp 16 | Ht 68.0 in | Wt 180.8 lb

## 2024-07-10 DIAGNOSIS — E041 Nontoxic single thyroid nodule: Secondary | ICD-10-CM

## 2024-07-10 NOTE — Progress Notes (Signed)
 Outpatient Endocrinology Note Erik Weld, MD   Patient's Name: Erik Mayer    DOB: 1981/06/19    MRN: [id number]  REASON OF VISIT: Follow-up for low TSH/ thyroid  nodule  REFERRING PROVIDER: Lendia Boby CROME, NP-C  PCP: Lendia Boby CROME, NP-C  HISTORY OF PRESENT ILLNESS:   Erik Mayer is a 43 y.o. old male with past medical history as listed below is presented for follow-up for low TSH and thyroid  nodule.  Pertinent Thyroid  History: Patient was referred to endocrinology for evaluation and management of hyperthyroidism /low TSH and small left thyroid  nodule.  Initial consult on May 17, 2024.  Background: - Patient had complaints of becoming anxious, more irritable, sleep disturbance, irritable lately and frequent bowel movement.  He has mild weight loss.  He has increased hunger and heat intolerance/increased sweating.  He has noticed these symptoms for about 6 months.  He was evaluated by primary care provider in April 10, 2024 had thyroid  function test found to have mildly low TSH of 0.26 with low normal limit of 0.35.  At that time he also had testosterone  levels checked were in normal range.  He had ultrasound thyroid  on April 17, 2024 due to low TSH reviewed images overall mild heterogenous thyroid  parenchyma with tiny left  mildly hypoechoic thyroid  nodule measuring 0.33 cm.  No worrisome features.  No significantly large solid nodule noted.  -He has noticed some blurred vision otherwise no redness or watering of the eyes. - No IV iodine contrast use, no amiodarone use and no sickness at the time of blood test. - No anterior neck compressive symptoms. - No known family history of thyroid  disorder.  # On initial visit in October 2025 he had normal thyroid  function test and had normal thyroid  autoantibodies for thyroglobulin, anti-TPO, TRAb and TSI negative for Hashimoto thyroiditis and Graves' disease.  Interval history: Patient presented for follow-up.   He had normal thyroid  function test in last visit.  He had thyroid  function test few days ago normal as follows.  Patient reports he has been feeling somewhat better and sleeping better lately.  He has not been on thyroid  medication.  No obvious hypo or hyperthyroid symptoms.  No other complaints today.   Latest Reference Range & Units 07/04/24 16:48  TSH 0.40 - 4.50 mIU/L 0.64  Triiodothyronine,Free,Serum 2.3 - 4.2 pg/mL 3.6  T4,Free(Direct) 0.8 - 1.8 ng/dL 1.2    REVIEW OF SYSTEMS:  As per history of present illness.   PAST MEDICAL HISTORY: Past Medical History:  Diagnosis Date   Allergy    Anxiety and depression 07/14/2022   Encounter for general adult medical examination with abnormal findings 03/17/2023   Panic attacks 04/01/2022    PAST SURGICAL HISTORY: Past Surgical History:  Procedure Laterality Date   TONSILLECTOMY  08/02/2004    ALLERGIES: No Known Allergies  FAMILY HISTORY:  Family History  Problem Relation Age of Onset   Cancer Cousin     SOCIAL HISTORY: Social History   Socioeconomic History   Marital status: Married    Spouse name: Not on file   Number of children: Not on file   Years of education: Not on file   Highest education level: Bachelor's degree (e.g., BA, AB, BS)  Occupational History   Not on file  Tobacco Use   Smoking status: Never   Smokeless tobacco: Never  Substance and Sexual Activity   Alcohol use: Yes    Alcohol/week: 6.0 standard drinks of alcohol    Types: 3 Cans of  beer, 3 Shots of liquor per week    Comment: occasionally. 5 drinks a week.   Drug use: No   Sexual activity: Yes  Other Topics Concern   Not on file  Social History Narrative   Not on file   Social Drivers of Health   Financial Resource Strain: Low Risk  (03/08/2024)   Overall Financial Resource Strain (CARDIA)    Difficulty of Paying Living Expenses: Not hard at all  Food Insecurity: No Food Insecurity (03/08/2024)   Hunger Vital Sign    Worried About  Running Out of Food in the Last Year: Never true    Ran Out of Food in the Last Year: Never true  Transportation Needs: No Transportation Needs (03/08/2024)   PRAPARE - Administrator, Civil Service (Medical): No    Lack of Transportation (Non-Medical): No  Physical Activity: Sufficiently Active (03/08/2024)   Exercise Vital Sign    Days of Exercise per Week: 7 days    Minutes of Exercise per Session: 60 min  Stress: Stress Concern Present (03/08/2024)   [employer] of Occupational Health - Occupational Stress Questionnaire    Feeling of Stress: To some extent  Social Connections: Moderately Isolated (03/08/2024)   Social Connection and Isolation Panel    Frequency of Communication with Friends and Family: Never    Frequency of Social Gatherings with Friends and Family: More than three times a week    Attends Religious Services: Never    Database Administrator or Organizations: Yes    Attends Engineer, Structural: More than 4 times per year    Marital Status: Separated    MEDICATIONS:  Current Outpatient Medications  Medication Sig Dispense Refill   ALPRAZolam  (XANAX ) 0.25 MG tablet TAKE 1 TABLET BY MOUTH 2 TIMES A DAY AS NEEDED FOR ANXIETY 20 tablet 1   augmented betamethasone dipropionate (DIPROLENE-AF) 0.05 % ointment Apply 1 Application topically 2 (two) times daily.     traZODone  (DESYREL ) 50 MG tablet Take 1 tablet (50 mg total) by mouth at bedtime. 90 tablet 0   No current facility-administered medications for this visit.    PHYSICAL EXAM: Vitals:   07/10/24 1525  BP: 112/72  Pulse: 68  Resp: 16  SpO2: 98%  Weight: 180 lb 12.8 oz (82 kg)  Height: 5' 8 (1.727 m)   Body mass index is 27.49 kg/m.  Wt Readings from Last 3 Encounters:  07/10/24 180 lb 12.8 oz (82 kg)  05/17/24 171 lb 9.6 oz (77.8 kg)  04/10/24 170 lb (77.1 kg)    General: Well developed, well nourished male in no apparent distress.  HEENT: AT/[place], no external lesions. Hearing  intact to the spoken word Eyes: No stare, proptosis or lid lag. Conjunctiva clear and no icterus. No erythema or watering Neurologic: Alert, oriented, normal speech Extremities: No pedal pitting edema, no tremors of outstretched hands Skin: Warm, color good.  Psychiatric: Does not appear depressed or anxious  PERTINENT HISTORIC LABORATORY AND IMAGING STUDIES:  All pertinent laboratory results were reviewed. Please see HPI also for further details.   TSH  Date Value Ref Range Status  07/04/2024 0.64 0.40 - 4.50 mIU/L Final  05/17/2024 0.92 0.40 - 4.50 mIU/L Final  04/10/2024 0.26 (L) 0.35 - 5.50 uIU/mL Final    Lab Results  Component Value Date   FREET4 1.2 07/04/2024   FREET4 1.3 05/17/2024   FREET4 1.04 04/01/2022   T3FREE 3.6 07/04/2024   T3FREE 4.1 05/17/2024   TSH  0.64 07/04/2024   TSH 0.92 05/17/2024   TSH 0.26 (L) 04/10/2024    No results found for: THYROTRECAB  Lab Results  Component Value Date   TSH 0.64 07/04/2024   TSH 0.92 05/17/2024   TSH 0.26 (L) 04/10/2024   FREET4 1.2 07/04/2024   FREET4 1.3 05/17/2024   FREET4 1.04 04/01/2022     Lab Results  Component Value Date   TSI <89 05/17/2024     No components found for: TRAB     EXAM: On April 17, 2024 reviewed images. THYROID  ULTRASOUND   TECHNIQUE: Ultrasound examination of the thyroid  gland and adjacent soft tissues was performed.   COMPARISON:  None Available.   FINDINGS: Parenchymal Echotexture: Mildly heterogenous   Isthmus: 3 mm   Right lobe: 6.3 x 1.4 x 2.0 cm   Left lobe: 5.9 x 1.7 x 2.0 cm   _________________________________________________________   Estimated total number of nodules >/= 1 cm: 0   Number of spongiform nodules >/=  2 cm not described below (TR1): 0   Number of mixed cystic and solid nodules >/= 1.5 cm not described below (TR2): 0   _________________________________________________________   Mild thyroid  heterogeneity and enlargement compatible with  medical thyroid  disease. Normal vascularity. Incidental subcentimeter hypoechoic TR 4 type nodule in the left mid thyroid  measures only 3 mm. No follow-up or biopsy recommended. No regional adenopathy.   IMPRESSION: Mild thyroid  heterogeneity and enlargement compatible with medical thyroid  disease.   No other significant abnormality by ultrasound.   Latest Reference Range & Units 05/17/24 13:53  Thyroglobulin Ab < or = 1 IU/mL <1  Thyroperoxidase Ab SerPl-aCnc <9 IU/mL <1  Thyroid  stimulating immunoglobulin  Rpt  TSI <140 % baseline <89  TRAB <=2.00 IU/L <1.00  Rpt: View report in Results Review for more information  ASSESSMENT / PLAN  1. Thyroid  nodule    Patient had low TSH suggestive of hyperthyroidism with clinical features of hyperthyroidism.  On initial evaluation in October 2025 he had normal thyroid  function test including normal thyroid  autoantibodies for Graves' disease and Hashimoto's thyroiditis.  He might have transient subacute thyroiditis when TSH was mildly low in September with hyperthyroid symptoms.    He had ultrasound thyroid  in September 2025 noted to have small left thyroid  nodule measuring 0.33 cm, with no worrisome features.  Plan: - No plan or indication of thyroid  medication.  He is euthyroid. - Will continue to monitor left small thyroid  nodule with serial ultrasound, will plan to repeat ultrasound thyroid  in 1 year around follow-up of 2026. -Monitor thyroid  function test periodically, annually will be reasonable.  He will check thyroid  function test with annual visit with primary care provider ~ August 2026. - Follow-up in 1 year.   Diagnoses and all orders for this visit:  Thyroid  nodule -     US  THYROID ; Future    DISPOSITION Follow up in clinic in 12 months suggested.  All questions answered and patient verbalized understanding of the plan.  Yenni Carra, MD [facility] Endocrinology [facility] [address] [place], [address] [place], [place] [address] Phone # [phone]   At least part of this note was generated using voice recognition software. Inadvertent word errors may have occurred, which were not recognized during the proofreading process.

## 2024-07-24 ENCOUNTER — Encounter: Payer: Self-pay | Admitting: Family Medicine

## 2024-07-24 ENCOUNTER — Ambulatory Visit: Admitting: Family Medicine

## 2024-07-24 VITALS — BP 100/80 | HR 78 | Temp 97.8°F | Ht 68.0 in | Wt 181.2 lb

## 2024-07-24 DIAGNOSIS — L304 Erythema intertrigo: Secondary | ICD-10-CM | POA: Diagnosis not present

## 2024-07-24 NOTE — Progress Notes (Signed)
 "  Subjective:     Patient ID: Erik Mayer, male    DOB: [birth year], 43 y.o.   MRN: [id number]  Chief Complaint  Patient presents with   Acute Visit    Began 2wks ago 3rd one in short timeframe nystatin  cleared up 1st and 2nd occurrence but not this occurrence has not taken in past 24hrs    HPI  Discussed the use of AI scribe software for clinical note transcription with the patient, who gave verbal consent to proceed.  History of Present Illness Erik Mayer is a 43 year old male who presents with a recurring rash in the navel area.  Cutaneous rash in umbilical region - Recurrent large, pruritic, flaky rash localized to the navel area - Three episodes over the past three to four months - Rash persists despite prior treatments  Aggravating and contributing factors - Suspects exacerbation with heavy winter clothing layers and reusing shirts - Prolonged sitting increases moisture in the area  Prior treatments and response - Previously treated at urgent care with weekly fluconazole  - Used topical nystatin  for the past two weeks - Rash recurs despite antifungal therapy - Has tried cornstarch for moisture control     Health Maintenance Due  Topic Date Due   Hepatitis B Vaccines 19-59 Average Risk (1 of 3 - 19+ 3-dose series) Never done   HPV VACCINES (1 - 3-dose SCDM series) Never done   COVID-19 Vaccine (3 - 2025-26 season) 04/02/2024    Past Medical History:  Diagnosis Date   Allergy    Anxiety and depression 07/14/2022   Encounter for general adult medical examination with abnormal findings 03/17/2023   Panic attacks 04/01/2022    Past Surgical History:  Procedure Laterality Date   TONSILLECTOMY  08/02/2004    Family History  Problem Relation Age of Onset   Cancer Cousin     Social History   Socioeconomic History   Marital status: Married    Spouse name: Not on file   Number of children: Not on file   Years of education: Not on file    Highest education level: Bachelor's degree (e.g., BA, AB, BS)  Occupational History   Not on file  Tobacco Use   Smoking status: Never   Smokeless tobacco: Never  Substance and Sexual Activity   Alcohol use: Yes    Alcohol/week: 6.0 standard drinks of alcohol    Types: 3 Cans of beer, 3 Shots of liquor per week    Comment: occasionally. 5 drinks a week.   Drug use: No   Sexual activity: Yes  Other Topics Concern   Not on file  Social History Narrative   Not on file   Social Drivers of Health   Tobacco Use: Low Risk (07/24/2024)   Patient History    Smoking Tobacco Use: Never    Smokeless Tobacco Use: Never    Passive Exposure: Not on file  Financial Resource Strain: Low Risk (07/23/2024)   Overall Financial Resource Strain (CARDIA)    Difficulty of Paying Living Expenses: Not hard at all  Food Insecurity: No Food Insecurity (07/23/2024)   Epic    Worried About Radiation Protection Practitioner of Food in the Last Year: Never true    Ran Out of Food in the Last Year: Never true  Transportation Needs: No Transportation Needs (07/23/2024)   Epic    Lack of Transportation (Medical): No    Lack of Transportation (Non-Medical): No  Physical Activity: Sufficiently Active (07/23/2024)   Exercise Vital Sign  Days of Exercise per Week: 7 days    Minutes of Exercise per Session: 30 min  Stress: Stress Concern Present (07/23/2024)   [employer] of Occupational Health - Occupational Stress Questionnaire    Feeling of Stress: To some extent  Social Connections: Socially Isolated (07/23/2024)   Social Connection and Isolation Panel    Frequency of Communication with Friends and Family: Never    Frequency of Social Gatherings with Friends and Family: Three times a week    Attends Religious Services: Never    Active Member of Clubs or Organizations: No    Attends Banker Meetings: Not on file    Marital Status: Separated  Intimate Partner Violence: Not on file  Depression (PHQ2-9):  Low Risk (04/10/2024)   Depression (PHQ2-9)    PHQ-2 Score: 3  Alcohol Screen: Low Risk (07/23/2024)   Alcohol Screen    Last Alcohol Screening Score (AUDIT): 3  Housing: High Risk (07/23/2024)   Epic    Unable to Pay for Housing in the Last Year: No    Number of Times Moved in the Last Year: 3    Homeless in the Last Year: No  Utilities: Not on file  Health Literacy: Not on file    Outpatient Medications Prior to Visit  Medication Sig Dispense Refill   ALPRAZolam  (XANAX ) 0.25 MG tablet TAKE 1 TABLET BY MOUTH 2 TIMES A DAY AS NEEDED FOR ANXIETY (Patient taking differently: PRN) 20 tablet 1   augmented betamethasone dipropionate (DIPROLENE-AF) 0.05 % ointment Apply 1 Application topically 2 (two) times daily. (Patient taking differently: Apply 1 Application topically 2 (two) times daily. PRN)     traZODone  (DESYREL ) 50 MG tablet Take 1 tablet (50 mg total) by mouth at bedtime. (Patient taking differently: Take 50 mg by mouth at bedtime. PRN) 90 tablet 0   No facility-administered medications prior to visit.    Allergies[1]  Review of Systems  Constitutional:  Negative for chills and fever.  Gastrointestinal:  Negative for abdominal pain, nausea and vomiting.  Skin:        Itching and dryness in navel       Objective:    Physical Exam Constitutional:      General: He is not in acute distress.    Appearance: He is not ill-appearing.  Eyes:     Extraocular Movements: Extraocular movements intact.     Conjunctiva/sclera: Conjunctivae normal.  Cardiovascular:     Rate and Rhythm: Normal rate.  Pulmonary:     Effort: Pulmonary effort is normal.  Musculoskeletal:     Cervical back: Normal range of motion and neck supple.  Skin:    General: Skin is warm and dry.     Comments: Dry and flaky skin in umbilicus without erythema, edema, drainage or sign of infection   Neurological:     General: No focal deficit present.     Mental Status: He is alert and oriented to person,  place, and time.  Psychiatric:        Mood and Affect: Mood normal.        Behavior: Behavior normal.        Thought Content: Thought content normal.      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 Previous treatment with oral fluconazole . Current symptoms include itching and flakiness.  - Purchase miconazole powder from [facility] for moisture control. - Apply Lamisil or Lotrisone antifungal cream twice daily for one month. - Ensure navel area is dry after showering, using an air dryer if possible. - Avoid wearing the same shirt repeatedly to prevent reintroduction of fungus.     I am having Erik Mayer maintain his augmented betamethasone dipropionate, traZODone , and ALPRAZolam .  No orders of the defined types were placed in this encounter.      [1] No Known Allergies  "

## 2024-07-24 NOTE — Patient Instructions (Signed)
 Keep the area clean and dry.  Use over-the-counter miconazole powder  You can also use over-the-counter Lamisil or Lotrisone or any antifungal twice daily for the next month.
